# Patient Record
Sex: Female | Born: 1955 | Race: Black or African American | Hispanic: No | Marital: Married | Smoking: Never smoker
Health system: Southern US, Community
[De-identification: ages and names within clinical notes are randomized; demographics above are authoritative.]

## PROBLEM LIST (undated history)

## (undated) DIAGNOSIS — Z8659 Personal history of other mental and behavioral disorders: Secondary | ICD-10-CM

## (undated) DIAGNOSIS — I10 Essential (primary) hypertension: Secondary | ICD-10-CM

## (undated) DIAGNOSIS — G8929 Other chronic pain: Secondary | ICD-10-CM

## (undated) DIAGNOSIS — M549 Dorsalgia, unspecified: Secondary | ICD-10-CM

## (undated) DIAGNOSIS — F419 Anxiety disorder, unspecified: Secondary | ICD-10-CM

## (undated) DIAGNOSIS — E7800 Pure hypercholesterolemia, unspecified: Secondary | ICD-10-CM

## (undated) DIAGNOSIS — C50919 Malignant neoplasm of unspecified site of unspecified female breast: Secondary | ICD-10-CM

## (undated) DIAGNOSIS — F329 Major depressive disorder, single episode, unspecified: Secondary | ICD-10-CM

## (undated) DIAGNOSIS — M542 Cervicalgia: Secondary | ICD-10-CM

## (undated) DIAGNOSIS — G629 Polyneuropathy, unspecified: Secondary | ICD-10-CM

## (undated) DIAGNOSIS — M5416 Radiculopathy, lumbar region: Secondary | ICD-10-CM

## (undated) DIAGNOSIS — F32A Depression, unspecified: Secondary | ICD-10-CM

## (undated) HISTORY — PX: BREAST LUMPECTOMY: SHX2

## (undated) HISTORY — PX: THYROID SURGERY: SHX805

## (undated) HISTORY — PX: SHOULDER SURGERY: SHX246

## (undated) HISTORY — PX: CERVICAL DISC SURGERY: SHX588

## (undated) HISTORY — PX: BACK SURGERY: SHX140

---

## 2007-02-19 ENCOUNTER — Emergency Department (HOSPITAL_COMMUNITY): Admission: EM | Admit: 2007-02-19 | Discharge: 2007-02-19 | Payer: Self-pay | Admitting: Family Medicine

## 2012-11-21 ENCOUNTER — Emergency Department (HOSPITAL_COMMUNITY)
Admission: EM | Admit: 2012-11-21 | Discharge: 2012-11-21 | Disposition: A | Discharge status: Home / Self Care | Payer: BC Managed Care – PPO | Attending: Emergency Medicine | Admitting: Emergency Medicine

## 2012-11-21 ENCOUNTER — Encounter (HOSPITAL_COMMUNITY): Payer: Self-pay | Admitting: Emergency Medicine

## 2012-11-21 DIAGNOSIS — I10 Essential (primary) hypertension: Secondary | ICD-10-CM | POA: Insufficient documentation

## 2012-11-21 DIAGNOSIS — E7800 Pure hypercholesterolemia, unspecified: Secondary | ICD-10-CM | POA: Insufficient documentation

## 2012-11-21 DIAGNOSIS — L259 Unspecified contact dermatitis, unspecified cause: Secondary | ICD-10-CM | POA: Insufficient documentation

## 2012-11-21 DIAGNOSIS — Z79899 Other long term (current) drug therapy: Secondary | ICD-10-CM | POA: Insufficient documentation

## 2012-11-21 DIAGNOSIS — M5430 Sciatica, unspecified side: Secondary | ICD-10-CM | POA: Insufficient documentation

## 2012-11-21 DIAGNOSIS — Z853 Personal history of malignant neoplasm of breast: Secondary | ICD-10-CM | POA: Insufficient documentation

## 2012-11-21 DIAGNOSIS — M5431 Sciatica, right side: Secondary | ICD-10-CM

## 2012-11-21 HISTORY — DX: Personal history of other mental and behavioral disorders: Z86.59

## 2012-11-21 HISTORY — DX: Malignant neoplasm of unspecified site of unspecified female breast: C50.919

## 2012-11-21 HISTORY — DX: Pure hypercholesterolemia, unspecified: E78.00

## 2012-11-21 HISTORY — DX: Essential (primary) hypertension: I10

## 2012-11-21 MED ORDER — CYCLOBENZAPRINE HCL 10 MG PO TABS: 10.0000 mg | ORAL_TABLET | Freq: Two times a day (BID) | ORAL | Status: DC | PRN

## 2012-11-21 MED ORDER — PREDNISONE (PAK) 10 MG PO TABS: 10.0000 mg | ORAL_TABLET | Freq: Every day | ORAL | Status: DC

## 2012-11-21 NOTE — ED Provider Notes (Signed)
Medical screening examination/treatment/procedure(s) were performed by non-physician practitioner and as supervising physician I was immediately available for consultation/collaboration.  EKG Interpretation   None         Chiana Wamser, MD 11/21/12 1839 

## 2012-11-21 NOTE — ED Provider Notes (Signed)
CSN: [id number]     Arrival date & time 11/21/12  1706 History   First MD Initiated Contact with Patient 11/21/12 1718     Chief Complaint  Patient presents with  . Back Pain   (Consider location/radiation/quality/duration/timing/severity/associated sxs/prior Treatment) Patient is a 57 y.o. female presenting with back pain. The history is provided by the patient.  Back Pain Location:  Lumbar spine Quality:  Aching and burning Radiates to:  L posterior upper leg Pain severity:  Severe (7/10) Pain is:  Same all the time Onset quality:  Gradual Duration:  3 weeks Timing:  Constant Progression:  Worsening Chronicity:  New Relieved by:  Cold packs and being still Worsened by:  Twisting, standing, coughing, bending and ambulation Ineffective treatments:  None tried Associated symptoms: leg pain   Associated symptoms: no abdominal pain, no bladder incontinence, no bowel incontinence, no dysuria, no fever, no headaches and no pelvic pain   Risk factors: hx of cancer   Risk factors: no hx of osteoporosis and no steroid use    Amanda Peterson is a 57 y.o. female who presents to the ED with low back pain that radiates down her left leg. She saw her PCP and took some muscle relaxants but still has pain. She also complains of a rash on her arms, neck and face. Wants to get that checked out.   Past Medical History  Diagnosis Date  . Breast cancer   . Hypertension   . High cholesterol   . History of suicidal ideation    Past Surgical History  Procedure Laterality Date  . Cervical disc surgery    . Shoulder surgery    . Breast lumpectomy     No family history on file. History  Substance Use Topics  . Smoking status: Never Smoker   . Smokeless tobacco: Not on file  . Alcohol Use: No   OB History   Grav Para Term Preterm Abortions TAB SAB Ect Mult Living                 Review of Systems  Constitutional: Negative for fever and chills.  HENT: Negative for facial swelling, sore  throat and trouble swallowing.   Eyes: Negative for itching.  Gastrointestinal: Negative for nausea, vomiting, abdominal pain and bowel incontinence.  Genitourinary: Negative for bladder incontinence, dysuria and pelvic pain.  Musculoskeletal: Positive for back pain.  Skin: Positive for rash.  Allergic/Immunologic: Negative for food allergies.  Neurological: Negative for headaches.  Psychiatric/Behavioral: The patient is not nervous/anxious.     Allergies  Oxycodone; Codeine; and Darvocet  Home Medications   Current Outpatient Rx  Name  Route  Sig  Dispense  Refill  . acetaminophen (TYLENOL) 500 MG tablet   Oral   Take 500 mg by mouth every 6 (six) hours as needed for pain.         Marland Kitchen ALPRAZolam (XANAX) 0.5 MG tablet   Oral   Take 0.5 mg by mouth at bedtime as needed for sleep.         . busPIRone (BUSPAR) 7.5 MG tablet   Oral   Take 7.5 mg by mouth 2 (two) times daily.         . Calcium Carbonate-Vitamin D (CALCIUM + D PO)   Oral   Take 1 tablet by mouth 2 (two) times daily.         . DULoxetine (CYMBALTA) 60 MG capsule   Oral   Take 60 mg by mouth daily.         Marland Kitchen  hydrochlorothiazide (HYDRODIURIL) 12.5 MG tablet   Oral   Take 12.5 mg by mouth daily.         Marland Kitchen HYDROcodone-acetaminophen (NORCO/VICODIN) 5-325 MG per tablet   Oral   Take 1 tablet by mouth every 6 (six) hours as needed for pain.         Marland Kitchen lubiprostone (AMITIZA) 24 MCG capsule   Oral   Take 24 mcg by mouth 2 (two) times daily with a meal.         . metoprolol (LOPRESSOR) 50 MG tablet   Oral   Take 50 mg by mouth daily.         . mirtazapine (REMERON) 15 MG tablet   Oral   Take 15 mg by mouth at bedtime.         . pantoprazole (PROTONIX) 40 MG tablet   Oral   Take 40 mg by mouth daily.         . pravastatin (PRAVACHOL) 40 MG tablet   Oral   Take 40 mg by mouth at bedtime.         . tamoxifen (NOLVADEX) 20 MG tablet   Oral   Take 20 mg by mouth daily.         Marland Kitchen  venlafaxine (EFFEXOR) 37.5 MG tablet   Oral   Take 37.5 mg by mouth daily.          BP 144/67  Pulse 91  Temp(Src) 98.3 F (36.8 C) (Oral)  Resp 18  SpO2 97% Physical Exam  Nursing note and vitals reviewed. Constitutional: She is oriented to person, place, and time. She appears well-developed and well-nourished. No distress.  HENT:  Head: Atraumatic.  Eyes: EOM are normal.  Neck: Normal range of motion. Neck supple.  Cardiovascular: Normal rate, regular rhythm and normal heart sounds.   Pulmonary/Chest: Effort normal and breath sounds normal.  Musculoskeletal: Normal range of motion. She exhibits no edema.  Pedal pulses equal, adequate circulation, good touch sensation.  Neurological: She is alert and oriented to person, place, and time. She has normal strength and normal reflexes. No cranial nerve deficit or sensory deficit. Gait normal.  Skin: Rash noted.  There are areas of the forearms, neck and face that are linear and vesicular. Consistent with contact dermitis. Most likely poison ivy.   Psychiatric: She has a normal mood and affect. Her behavior is normal.    ED Course  Procedures   MDM  57 y.o. female with sciatica right and contact dermitis. Will treat with steroids and muscle relaxants. She will take Benadryl for itching as needed. She is stable for discharge home without any immediate complications. She is to follow up with her PCP.    Medication List    TAKE these medications       cyclobenzaprine 10 MG tablet  Commonly known as:  FLEXERIL  Take 1 tablet (10 mg total) by mouth 2 (two) times daily as needed for muscle spasms.     predniSONE 10 MG tablet  Commonly known as:  STERAPRED UNI-PAK  Take 1 tablet (10 mg total) by mouth daily. Take 6 tablets PO today then 5, 4, 3, 2, 1      ASK your doctor about these medications       acetaminophen 500 MG tablet  Commonly known as:  TYLENOL  Take 500 mg by mouth every 6 (six) hours as needed for pain.      ALPRAZolam 0.5 MG tablet  Commonly known as:  Prudy Feeler  Take 0.5 mg by mouth at bedtime as needed for sleep.     AMITIZA 24 MCG capsule  Generic drug:  lubiprostone  Take 24 mcg by mouth 2 (two) times daily with a meal.     busPIRone 7.5 MG tablet  Commonly known as:  BUSPAR  Take 7.5 mg by mouth 2 (two) times daily.     CALCIUM + D PO  Take 1 tablet by mouth 2 (two) times daily.     DULoxetine 60 MG capsule  Commonly known as:  CYMBALTA  Take 60 mg by mouth daily.     hydrochlorothiazide 12.5 MG tablet  Commonly known as:  HYDRODIURIL  Take 12.5 mg by mouth daily.     HYDROcodone-acetaminophen 5-325 MG per tablet  Commonly known as:  NORCO/VICODIN  Take 1 tablet by mouth every 6 (six) hours as needed for pain.     metoprolol 50 MG tablet  Commonly known as:  LOPRESSOR  Take 50 mg by mouth daily.     mirtazapine 15 MG tablet  Commonly known as:  REMERON  Take 15 mg by mouth at bedtime.     pantoprazole 40 MG tablet  Commonly known as:  PROTONIX  Take 40 mg by mouth daily.     pravastatin 40 MG tablet  Commonly known as:  PRAVACHOL  Take 40 mg by mouth at bedtime.     tamoxifen 20 MG tablet  Commonly known as:  NOLVADEX  Take 20 mg by mouth daily.     venlafaxine 37.5 MG tablet  Commonly known as:  EFFEXOR  Take 37.5 mg by mouth daily.            [facility] Orlene Och, [place] 11/21/12 [phone]

## 2012-11-21 NOTE — ED Notes (Signed)
Pt c/o lower back pain that radiates down left leg that started three weeks ago, denies any injury, states that the pain is worse when she attempts to move her left leg, also c/o rash that started Wednesday that has continued to spead.

## 2013-01-03 ENCOUNTER — Encounter (HOSPITAL_COMMUNITY): Payer: Self-pay | Admitting: Emergency Medicine

## 2013-01-03 ENCOUNTER — Emergency Department (HOSPITAL_COMMUNITY): Payer: BC Managed Care – PPO

## 2013-01-03 ENCOUNTER — Emergency Department (HOSPITAL_COMMUNITY)
Admission: EM | Admit: 2013-01-03 | Discharge: 2013-01-03 | Disposition: A | Discharge status: Home / Self Care | Payer: BC Managed Care – PPO | Attending: Emergency Medicine | Admitting: Emergency Medicine

## 2013-01-03 DIAGNOSIS — M542 Cervicalgia: Secondary | ICD-10-CM | POA: Insufficient documentation

## 2013-01-03 DIAGNOSIS — G8929 Other chronic pain: Secondary | ICD-10-CM | POA: Insufficient documentation

## 2013-01-03 DIAGNOSIS — M545 Low back pain, unspecified: Secondary | ICD-10-CM | POA: Insufficient documentation

## 2013-01-03 DIAGNOSIS — Z79899 Other long term (current) drug therapy: Secondary | ICD-10-CM | POA: Insufficient documentation

## 2013-01-03 DIAGNOSIS — Z853 Personal history of malignant neoplasm of breast: Secondary | ICD-10-CM | POA: Insufficient documentation

## 2013-01-03 DIAGNOSIS — E7800 Pure hypercholesterolemia, unspecified: Secondary | ICD-10-CM | POA: Insufficient documentation

## 2013-01-03 DIAGNOSIS — I10 Essential (primary) hypertension: Secondary | ICD-10-CM | POA: Insufficient documentation

## 2013-01-03 MED ORDER — METHOCARBAMOL 750 MG PO TABS: 750.0000 mg | ORAL_TABLET | Freq: Three times a day (TID) | ORAL | Status: DC

## 2013-01-03 MED ORDER — NAPROXEN 500 MG PO TABS: 500.0000 mg | ORAL_TABLET | Freq: Two times a day (BID) | ORAL | Status: DC

## 2013-01-03 NOTE — ED Notes (Signed)
Pt states lower back pain x 3 mo, radiating down both legs, more on the right leg. Pt also states intermittent numbness to 2nd digit of right hand x ~ 43mo with hx of cervical spine surgery.

## 2013-01-05 NOTE — ED Provider Notes (Signed)
CSN: [id number]     Arrival date & time 01/03/13  1226 History   First MD Initiated Contact with Patient 01/03/13 1345     Chief Complaint  Patient presents with  . Back Pain   (Consider location/radiation/quality/duration/timing/severity/associated sxs/prior Treatment) Patient is a 57 y.o. female presenting with back pain. The history is provided by the patient.  Back Pain Location:  Lumbar spine Quality:  Aching and shooting Radiates to:  R thigh Pain severity:  Moderate Pain is:  Same all the time Onset quality:  Gradual Duration:  3 months Timing:  Constant Progression:  Unchanged Chronicity:  Chronic Context: not falling, not recent illness and not recent injury   Relieved by:  Narcotics Worsened by:  Bending, twisting and standing Ineffective treatments:  None tried Associated symptoms: leg pain   Associated symptoms: no abdominal pain, no abdominal swelling, no bladder incontinence, no bowel incontinence, no chest pain, no dysuria, no fever, no headaches, no numbness, no paresthesias, no pelvic pain, no perianal numbness, no tingling and no weakness    Patient also c/o chronic neck pain since having surgery several years ago.  States that she has tingling and numbness to the right index finger for one month.  States numbness "comes and goes".  She denies fever, neck stiffness, extremity weakness, dysuria, or saddle anesthesia's. Denies recent injury  Past Medical History  Diagnosis Date  . Breast cancer   . Hypertension   . High cholesterol   . History of suicidal ideation    Past Surgical History  Procedure Laterality Date  . Cervical disc surgery    . Shoulder surgery    . Breast lumpectomy     No family history on file. History  Substance Use Topics  . Smoking status: Never Smoker   . Smokeless tobacco: Not on file  . Alcohol Use: No   OB History   Grav Para Term Preterm Abortions TAB SAB Ect Mult Living                 Review of Systems   Constitutional: Negative for fever.  Eyes: Negative for visual disturbance.  Respiratory: Negative for shortness of breath.   Cardiovascular: Negative for chest pain.  Gastrointestinal: Negative for vomiting, abdominal pain, constipation and bowel incontinence.  Genitourinary: Negative for bladder incontinence, dysuria, hematuria, flank pain, decreased urine volume, difficulty urinating and pelvic pain.       No perineal numbness or incontinence of urine or feces  Musculoskeletal: Positive for back pain and neck pain. Negative for gait problem, joint swelling and neck stiffness.  Skin: Negative for rash.  Neurological: Negative for dizziness, tingling, facial asymmetry, weakness, numbness, headaches and paresthesias.  All other systems reviewed and are negative.    Allergies  Oxycodone; Codeine; and Darvocet  Home Medications   Current Outpatient Rx  Name  Route  Sig  Dispense  Refill  . acetaminophen (TYLENOL) 500 MG tablet   Oral   Take 1,000 mg by mouth every 6 (six) hours as needed for mild pain.          Marland Kitchen ALPRAZolam (XANAX) 0.5 MG tablet   Oral   Take 0.5 mg by mouth at bedtime as needed for sleep.         Marland Kitchen amLODipine (NORVASC) 2.5 MG tablet   Oral   Take 2.5 mg by mouth daily.         . busPIRone (BUSPAR) 7.5 MG tablet   Oral   Take 7.5 mg by mouth 2 (  two) times daily.         . DULoxetine (CYMBALTA) 60 MG capsule   Oral   Take 60 mg by mouth daily.         . ergocalciferol (VITAMIN D2) 50000 UNITS capsule   Oral   Take 50,000 Units by mouth once a week. saturday         . hydrochlorothiazide (HYDRODIURIL) 12.5 MG tablet   Oral   Take 12.5 mg by mouth daily.         Marland Kitchen HYDROcodone-acetaminophen (NORCO/VICODIN) 5-325 MG per tablet   Oral   Take 1 tablet by mouth every 6 (six) hours as needed for pain.         . metoprolol succinate (TOPROL-XL) 50 MG 24 hr tablet   Oral   Take 50 mg by mouth daily. Take with or immediately following a  meal.         . mirtazapine (REMERON) 15 MG tablet   Oral   Take 15 mg by mouth at bedtime.         . nabumetone (RELAFEN) 750 MG tablet   Oral   Take 750 mg by mouth 2 (two) times daily.         . pantoprazole (PROTONIX) 40 MG tablet   Oral   Take 40 mg by mouth daily.         . pravastatin (PRAVACHOL) 40 MG tablet   Oral   Take 40 mg by mouth at bedtime.         . tamoxifen (NOLVADEX) 20 MG tablet   Oral   Take 20 mg by mouth daily.         Marland Kitchen venlafaxine (EFFEXOR) 37.5 MG tablet   Oral   Take 37.5 mg by mouth daily.         . methocarbamol (ROBAXIN) 750 MG tablet   Oral   Take 1 tablet (750 mg total) by mouth 3 (three) times daily.   21 tablet   0   . naproxen (NAPROSYN) 500 MG tablet   Oral   Take 1 tablet (500 mg total) by mouth 2 (two) times daily. Take with food   20 tablet   0    BP 132/73  Pulse 80  Temp(Src) 98.2 F (36.8 C) (Oral)  Resp 16  Ht 5' 5.5" (1.664 m)  Wt 210 lb (95.255 kg)  BMI 34.40 kg/m2  SpO2 100% Physical Exam  Nursing note and vitals reviewed. Constitutional: She is oriented to person, place, and time. She appears well-developed and well-nourished. No distress.  HENT:  Head: Normocephalic and atraumatic.  Mouth/Throat: Oropharynx is clear and moist.  Eyes: EOM are normal. Pupils are equal, round, and reactive to light.  Neck: Normal range of motion and phonation normal. Neck supple. Muscular tenderness present. No spinous process tenderness present. No rigidity. No erythema present. No Brudzinski's sign and no Kernig's sign noted. No thyromegaly present.  Well healed anterior and posterior surgical scars.  No STS.   Grip strength is slightly diminished on right.  Distal sensation intact,  CR < 2 sec.  No edema, erythema of the right index finger.  Pt has full ROM of the bilateral UE's and index finger.     Cardiovascular: Normal rate, regular rhythm, normal heart sounds and intact distal pulses.   No murmur  heard. Pulmonary/Chest: Effort normal and breath sounds normal. No respiratory distress. She exhibits no tenderness.  Abdominal: Soft. She exhibits no distension. There is no tenderness.  Musculoskeletal: She exhibits tenderness. She exhibits no edema.       Cervical back: She exhibits tenderness. She exhibits normal range of motion, no bony tenderness, no swelling, no deformity, no spasm and normal pulse.       Lumbar back: She exhibits tenderness and pain. She exhibits normal range of motion, no swelling, no deformity, no laceration and normal pulse.  ttp of the lumbar paraspinal muscles.  No spinal tenderness.  DP pulses are brisk and symmetrical.  Distal sensation intact.  Hip Flexors/Extensors are intact  Lymphadenopathy:    She has no cervical adenopathy.  Neurological: She is alert and oriented to person, place, and time. She has normal strength. No sensory deficit. She exhibits normal muscle tone. Coordination and gait normal.  Reflex Scores:      Tricep reflexes are 2+ on the right side and 2+ on the left side.      Bicep reflexes are 2+ on the right side and 2+ on the left side.      Patellar reflexes are 2+ on the right side and 2+ on the left side.      Achilles reflexes are 2+ on the right side and 2+ on the left side. Skin: Skin is warm and dry. No rash noted.    ED Course  Procedures (including critical care time) Labs Review Labs Reviewed - No data to display Imaging Review Dg Lumbar Spine Complete  01/03/2013   CLINICAL DATA:  Low back pain  EXAM: LUMBAR SPINE - COMPLETE 4+ VIEW  COMPARISON:  None.  FINDINGS: Five lumbar type vertebral bodies.  Normal lumbar lordosis.  No evidence fracture dislocation. Vertebral body heights and intervertebral disc spaces are maintained.  Visualized bony pelvis appears intact.  Calcified pelvic phleboliths.  IMPRESSION: Normal lumbar spine radiographs.   Electronically Signed   By: Charline Bills M.D.   On: 01/03/2013 14:53   Ct Cervical  Spine Wo Contrast  01/03/2013   CLINICAL DATA:  Neck pain, finger numbness, history of breast cancer  EXAM: CT CERVICAL SPINE WITHOUT CONTRAST  TECHNIQUE: Multidetector CT imaging of the cervical spine was performed without intravenous contrast. Multiplanar CT image reconstructions were also generated.  COMPARISON:  None.  FINDINGS: Normal cervical lordosis.  Prior C5-6 ACDF.  Additional anterior approach interbody fusion C4-5. Complete osseous fusion is not yet present.  No fracture or dislocation is seen. Vertebral body heights are maintained.  Mild degenerative changes at C4-5, C5-6, and C6-7.  No prevertebral soft tissue swelling.  No focal osseous lesions.  Visualized lung apices are clear.  IMPRESSION: No fracture or dislocation is seen.  Postsurgical changes involving C4 through C6, as described above.  Mild degenerative changes at C4-5, C5-6, and C6-7.   Electronically Signed   By: Charline Bills M.D.   On: 01/03/2013 15:27    EKG Interpretation   None       MDM   1. Neck pain, chronic   2. Low back pain    Imaging results discussed with pt.  No concerning sx's for emergent neurological or infectious process.  No focal neuro deficits on exam, pt ambulates with steady gait.  Pt has appt with her orthopedic MD in [place], [place] on Thursday.  Will prescribe naprosyn and robaxin and advised to f/u with her orthopedic.  Pt verbalized understanding and agrees to plan.  VSS.  She appears stable for d/c    Normalee Sistare L. Winifred Balogh, PA-C 01/05/13 1235

## 2013-01-06 NOTE — ED Provider Notes (Signed)
Medical screening examination/treatment/procedure(s) were performed by non-physician practitioner and as supervising physician I was immediately available for consultation/collaboration.  EKG Interpretation   None        Doug Sou, MD 01/06/13 1152

## 2013-02-28 ENCOUNTER — Emergency Department (HOSPITAL_COMMUNITY): Payer: BC Managed Care – PPO

## 2013-02-28 ENCOUNTER — Emergency Department (HOSPITAL_COMMUNITY)
Admission: EM | Admit: 2013-02-28 | Discharge: 2013-02-28 | Discharge status: Against Medical Advice | Payer: BC Managed Care – PPO | Attending: Emergency Medicine | Admitting: Emergency Medicine

## 2013-02-28 ENCOUNTER — Encounter (HOSPITAL_COMMUNITY): Payer: Self-pay | Admitting: Emergency Medicine

## 2013-02-28 DIAGNOSIS — E876 Hypokalemia: Secondary | ICD-10-CM | POA: Insufficient documentation

## 2013-02-28 DIAGNOSIS — R5383 Other fatigue: Secondary | ICD-10-CM

## 2013-02-28 DIAGNOSIS — R221 Localized swelling, mass and lump, neck: Secondary | ICD-10-CM

## 2013-02-28 DIAGNOSIS — F3289 Other specified depressive episodes: Secondary | ICD-10-CM | POA: Insufficient documentation

## 2013-02-28 DIAGNOSIS — Z79899 Other long term (current) drug therapy: Secondary | ICD-10-CM | POA: Insufficient documentation

## 2013-02-28 DIAGNOSIS — I10 Essential (primary) hypertension: Secondary | ICD-10-CM | POA: Insufficient documentation

## 2013-02-28 DIAGNOSIS — G8929 Other chronic pain: Secondary | ICD-10-CM | POA: Insufficient documentation

## 2013-02-28 DIAGNOSIS — R220 Localized swelling, mass and lump, head: Secondary | ICD-10-CM | POA: Insufficient documentation

## 2013-02-28 DIAGNOSIS — E7800 Pure hypercholesterolemia, unspecified: Secondary | ICD-10-CM | POA: Insufficient documentation

## 2013-02-28 DIAGNOSIS — F329 Major depressive disorder, single episode, unspecified: Secondary | ICD-10-CM | POA: Insufficient documentation

## 2013-02-28 DIAGNOSIS — Z791 Long term (current) use of non-steroidal anti-inflammatories (NSAID): Secondary | ICD-10-CM | POA: Insufficient documentation

## 2013-02-28 DIAGNOSIS — Z853 Personal history of malignant neoplasm of breast: Secondary | ICD-10-CM | POA: Insufficient documentation

## 2013-02-28 DIAGNOSIS — F411 Generalized anxiety disorder: Secondary | ICD-10-CM | POA: Insufficient documentation

## 2013-02-28 DIAGNOSIS — Z8739 Personal history of other diseases of the musculoskeletal system and connective tissue: Secondary | ICD-10-CM | POA: Insufficient documentation

## 2013-02-28 HISTORY — DX: Other chronic pain: G89.29

## 2013-02-28 HISTORY — DX: Anxiety disorder, unspecified: F41.9

## 2013-02-28 HISTORY — DX: Cervicalgia: M54.2

## 2013-02-28 HISTORY — DX: Dorsalgia, unspecified: M54.9

## 2013-02-28 HISTORY — DX: Depression, unspecified: F32.A

## 2013-02-28 HISTORY — DX: Major depressive disorder, single episode, unspecified: F32.9

## 2013-02-28 HISTORY — DX: Radiculopathy, lumbar region: M54.16

## 2013-02-28 LAB — LACTIC ACID, PLASMA: Lactic Acid, Venous: 0.8 mmol/L (ref 0.5–2.2)

## 2013-02-28 LAB — CBC WITH DIFFERENTIAL/PLATELET
Basophils Absolute: 0 10*3/uL (ref 0.0–0.1)
Basophils Relative: 1 % (ref 0–1)
Eosinophils Absolute: 0.2 10*3/uL (ref 0.0–0.7)
Eosinophils Relative: 4 % (ref 0–5)
HEMATOCRIT: 37.8 % (ref 36.0–46.0)
Hemoglobin: 12.8 g/dL (ref 12.0–15.0)
LYMPHS PCT: 25 % (ref 12–46)
Lymphs Abs: 1.3 10*3/uL (ref 0.7–4.0)
MCH: 30.3 pg (ref 26.0–34.0)
MCHC: 33.9 g/dL (ref 30.0–36.0)
MCV: 89.4 fL (ref 78.0–100.0)
MONO ABS: 0.3 10*3/uL (ref 0.1–1.0)
MONOS PCT: 6 % (ref 3–12)
NEUTROS ABS: 3.5 10*3/uL (ref 1.7–7.7)
Neutrophils Relative %: 65 % (ref 43–77)
Platelets: 271 10*3/uL (ref 150–400)
RBC: 4.23 MIL/uL (ref 3.87–5.11)
RDW: 13.6 % (ref 11.5–15.5)
WBC: 5.3 10*3/uL (ref 4.0–10.5)

## 2013-02-28 LAB — URINALYSIS W MICROSCOPIC + REFLEX CULTURE
Bilirubin Urine: NEGATIVE
GLUCOSE, UA: NEGATIVE mg/dL
HGB URINE DIPSTICK: NEGATIVE
Ketones, ur: NEGATIVE mg/dL
Leukocytes, UA: NEGATIVE
Nitrite: NEGATIVE
PH: 6 (ref 5.0–8.0)
PROTEIN: NEGATIVE mg/dL
Specific Gravity, Urine: 1.02 (ref 1.005–1.030)
Urobilinogen, UA: 0.2 mg/dL (ref 0.0–1.0)

## 2013-02-28 LAB — BASIC METABOLIC PANEL
BUN: 12 mg/dL (ref 6–23)
CALCIUM: 9.5 mg/dL (ref 8.4–10.5)
CHLORIDE: 102 meq/L (ref 96–112)
CO2: 27 meq/L (ref 19–32)
CREATININE: 0.93 mg/dL (ref 0.50–1.10)
GFR calc Af Amer: 76 mL/min — ABNORMAL LOW (ref >90)
GFR calc non Af Amer: 66 mL/min — ABNORMAL LOW (ref >90)
GLUCOSE: 92 mg/dL (ref 70–99)
Potassium: 3.5 mEq/L — ABNORMAL LOW (ref 3.7–5.3)
Sodium: 141 mEq/L (ref 137–147)

## 2013-02-28 LAB — TROPONIN I: Troponin I: <0.3 ng/mL (ref <0.30)

## 2013-02-28 MED ORDER — SODIUM CHLORIDE 0.9 % IV SOLN
INTRAVENOUS | Status: DC
  Administered 2013-02-28: 16:00:00 via INTRAVENOUS

## 2013-02-28 MED ORDER — IOHEXOL 300 MG/ML  SOLN: 75.0000 mL | Freq: Once | INTRAMUSCULAR | Status: DC | PRN

## 2013-02-28 MED ORDER — POTASSIUM CHLORIDE 20 MEQ/15ML (10%) PO LIQD
40.0000 meq | Freq: Once | ORAL | Status: AC
  Administered 2013-02-28: 40 meq via ORAL
  Filled 2013-02-28: qty 30

## 2013-02-28 NOTE — Discharge Instructions (Signed)
°Emergency Department Resource Guide °1) Find a Doctor and Pay Out of Pocket °Although you won't have to find out who is covered by your insurance plan, it is a good idea to ask around and get recommendations. You will then need to call the office and see if the doctor you have chosen will accept you as a new patient and what types of options they offer for patients who are self-pay. Some doctors offer discounts or will set up payment plans for their patients who do not have insurance, but you will need to ask so you aren't surprised when you get to your appointment. ° °2) Contact Your Local Health Department °Not all health departments have doctors that can see patients for sick visits, but many do, so it is worth a call to see if yours does. If you don't know where your local health department is, you can check in your phone book. The CDC also has a tool to help you locate your state's health department, and many state websites also have listings of all of their local health departments. ° °3) Find a Walk-in Clinic °If your illness is not likely to be very severe or complicated, you may want to try a walk in clinic. These are popping up all over the country in pharmacies, drugstores, and shopping centers. They're usually staffed by nurse practitioners or physician assistants that have been trained to treat common illnesses and complaints. They're usually fairly quick and inexpensive. However, if you have serious medical issues or chronic medical problems, these are probably not your best option. ° °No Primary Care Doctor: °- Call [facility] at  832-8000 - they can help you locate a primary care doctor that  accepts your insurance, provides certain services, etc. °- Physician Referral Service- 1-800-533-3463 ° °Chronic Pain Problems: °Organization         Address  Phone   Notes  °[facility]  (336) 297-2271 Patients need to be referred by their primary care doctor.  ° °Medication  Assistance: °Organization         Address  Phone   Notes  °[place] [facility] [address] °[place], [place] [address] (336) 641-8030 --Must be a resident of [place] °-- Must have NO insurance coverage whatsoever (no Medicaid/ Medicare, etc.) °-- The pt. MUST have a primary care doctor that directs their care regularly and follows them in the community °  °[facility]  (866) 331-1348   °[employer]  (888) 892-1162   ° °Agencies that provide inexpensive medical care: °Organization         Address  Phone   Notes  °[facility]  (336) 832-8035   ° Internal Medicine    (336) 832-7272   °[facility] [address] °[place], [place] [address] (336) 832-4777   °[facility] [address], °[place] (336) 271-4999   °[facility]    (336) 373-0678   °[facility]    (336) 272-1050   °[facility] ° [address], [place] Phone:  (336) 832-4444, Fax:  (336) 832-4440 Hours of Operation:  9 am - 6 pm, M-F.  Also accepts Medicaid/Medicare and self-pay.  °[facility] ° [address], [place] Phone: (336) [phone], Fax: (336) [phone]. Hours of Operation:  8:30 am - 5:30 pm, M-F.  Also accepts Medicaid and self-pay.  °[facility] [place] [address], [place] Phone: (336) 878-6027   °[facility] [address], [place], [place] (336)723-1848, Ext. 123 Mondays & Thursdays: 7-9 AM.  First 15 patients are seen on a first come, first serve basis. °  ° °Medicaid-accepting [place] Providers: ° °Organization         Address  Phone   Notes  °[facility] [address], [place] (336) 641-2100 Also accepts self-pay patients.  °[facility] [address], [place] ° (336) 856-9996   °[facility] [address], [place]  (336) 288-8857   °[facility] [address] [place] [address], [place] (336) 299-7000   °Veita Bland [address], [place]  ° (336) 373-1557 Only accepts [place] Access Medicaid patients after they have their name applied to their card.  ° °Self-Pay (no insurance) in [place]: ° °Organization         Address  Phone   Notes  °Sickle Cell Patients, [facility] [address], [place] (336) 832-1970   °[facility] [address], [place] (336) 832-4400   °[facility] ° [address], [place] (336) 992-4800   °[facility]/Dr. Osei-Bonsu ° [address] [place] [address], [place] or [address], [place] (336) [phone] Phone number for both [place] and [place] locations is the same.  °[facility] [address], [place] (336) 299-0000   °[facility] [address] [place] [address], [place] or [address] (336) 852-7530 °(336) 878-2260   °[facility] [address], [place] (336) 350-1642, phone; (336) 294-5005, fax Sees patients 1st and 3rd Saturday of every month.  Must not qualify for public or private insurance (i.e. Medicaid, Medicare, [place] Health Choice, Veterans' Benefits) • Household income should be no more than 200% of the poverty level •The clinic cannot treat you if you are pregnant or think you are pregnant • Sexually transmitted diseases are not treated at the clinic.  ° ° °Dental Care: °Organization         Address  Phone  Notes  °[place] [facility] [address], [place] (336) 641-6152 Accepts children up to age 21 who are enrolled in Medicaid or [place] Health Choice; pregnant women with a Medicaid card; and children who have applied for Medicaid or [place] Health Choice, but were declined, whose parents can pay a reduced fee at time of service.  °[place]  [facility] [place]  [address], [place] (336) 641-7733 Accepts children up to age 21 who are enrolled in Medicaid or [place] Health Choice; pregnant women with a Medicaid card; and children who have applied for Medicaid or  Health Choice, but were declined, whose parents can pay a reduced fee at time of service.  °[facility] ° [address],  (336) 641-4533 Patients are seen by appointment only. Walk-ins are not accepted. [facility] will see patients 18 years of age and older. °Monday - Tuesday (8am-5pm) °Most Wednesdays (8:30-5pm) °$30 per visit, cash only  °[facility] ° [address], [place] (336) 641-4533 Patients are seen by appointment only. Walk-ins are not accepted. [facility] will see patients 18 years of age and older. °One   Wednesday Evening (Monthly: Volunteer Based).  $30 per visit, cash only  °[facility]  (919) [phone] for adults; Children under age 4, call Graduate Pediatric Dentistry at ([phone]) [phone]. Children aged 4-14, please call (919) [phone] to request a pediatric application. ° Dental services are provided in all areas of dental care including fillings, crowns and bridges, complete and partial dentures, implants, gum treatment, root canals, and extractions. Preventive care is also provided. Treatment is provided to both adults and children. °Patients are selected via a lottery and there is often a waiting list. °  °[facility] [address], °[place] ° (336) 763-8833 [email or web address] °  °[facility] [address], [place], [place] (336)723-1848, Ext. 123 Second and Fourth Thursday of each month, opens at 6:30 AM; Clinic ends at 9 AM.  Patients are seen on a first-come first-served basis, and a limited number are seen during each clinic.  ° °[facility] ° [address], [place], [place] (336) [phone]    Eligibility Requirements °You must have lived in [place], [place], or [place] counties for at least the last three months. °  You cannot be eligible for state or federal sponsored healthcare insurance, including Veterans Administration, Medicaid, or Medicare. °  You generally cannot be eligible for healthcare insurance through your employer.  °  How to apply: °Eligibility screenings are held every Tuesday and Wednesday afternoon from 1:00 pm until 4:00 pm. You do not need an appointment for the interview!  °[facility] [address], [place], [place] 336-631-2330   °[facility] [facility]  336-342-8273   °[place]  336-703-3100   °[facility]  336-570-6415   ° °Behavioral Health Resources in the Community: °Intensive Outpatient Programs °Organization         Address  Phone  Notes  °[facility] [address], [place], [place] 336-878-6098   °[facility] [address], [place], [place] 336-832-9800   °[facility] [address], [place], [place] ° 336-882-2125   °[facility] [address],  °[place], [place] 1-800-853-5163 or 336-641-4981   °Substance Abuse Resources °Organization         Address  Phone  Notes  °[facility]  336-882-2125   °[facility]  336-784-9470   °[facility]  336-285-9073   °[facility]  336-845-3988   °Residential & Outpatient Substance Abuse Program  1-800-659-3381   °Psychological Services °Organization         Address  Phone  Notes  °[facility]  336- 832-9600   °[facility]  336- 378-7881   °[facility] [address], [place] 1-800-853-5163 or 336-641-4981   ° °Mobile Crisis Teams °Organization         Address  Phone  Notes  °[facility], Mobile Crisis Care Unit  1-877-626-1772   °Assertive °[facility] ° [address]  [place], [place] 336-834-9664   °Sharon DeEsch [address] °[place] 336-554-5454   ° °Self-Help/Support Groups °Organization         Address  Phone             Notes  °[facility] - variety of support groups  336- 373-1402 Call for more information  °Narcotics Anonymous (NA), [facility] [address], °[place]  2 meetings at this location  ° °  Residential Treatment Programs °Organization         Address  Phone  Notes  °[facility] [address],    °[place]  1-866-801-8205   °[facility] ° [address], [place], [place] 704-293-8524   °[facility] [address], [place] 336-845-3988 Admissions: 8am-3pm M-F  °[facility] [address]    °[place], [place] 336-841-1104   °[facility] [address], [place], [place] 336-379-7146   °[facility] [address]  °[place], [place] 336-285-9073   °[facility] Intensive Outpatient [address], [place], [place] 336-852-3033   °[facility] [address]  °[place], [place] 1-877-615-2722 or 336-784-9470   °[facility] [address] [place], [place] 336-227-7417 Accepts Medicaid  °[facility] [address]  °[place] 1-800-659-3381 Substance Abuse/Addiction Treatment  ° °[place] Behavioral Health Resources °Organization         Address  Phone  Notes  °[facility]  (888) 581-9988   °Julie Brannon, PhD [address] [place], [place]   (336) 349-5553 or (336) 951-0000   °[facility]   [address] °[place], [place] (336) 349-4454   °[facility] [address], [place], [place] (336) 342-8316 Insurance/Medicaid/sponsorship through [facility]  °[facility] [address]                                    [place], [place] (336) 342-8316 Therapy/tele-psych/case    °[facility] [address]  ° [place], [place] (336) 349-2233    °Dr. Arfeen  (336) 349-4544   °[facility] [place]  [employer] [place] [facility] 1) [address], [place] °2) [address], [place] °3)  [address], [place] (336) 349-3220 °(336) 342-7768 ° °(336) 342-8140   °[place] [facility] (336) 342-1394 or (336) 342-3537 (After Hours)    ° ° ° °Take your usual prescriptions as previously directed.  Call your regular medical doctor tomorrow to schedule a follow up appointment within the next 2 days. Return to the Emergency Department immediately sooner if worsening.  ° °

## 2013-02-28 NOTE — ED Notes (Signed)
Pt wanting to leave, pt has refused CT and 2 view xray

## 2013-02-28 NOTE — ED Notes (Signed)
Patient given discharge instruction, verbalized understand. IV removed, band aid applied. Patient ambulatory out of the department.  

## 2013-02-28 NOTE — ED Notes (Signed)
Family at bedside. Patient walked to restroom. Obtained urine sample. Patient asking for a drink, explained will have to check with EDP.

## 2013-02-28 NOTE — ED Notes (Signed)
Pt back from CT. Pt refused CT. Melissa from [facility] stated that pt got up from table and refused testing, stating she was claustrophobic. Pt also refused CXR while in radiology dept. Dr. Thurnell Garbe in to speak with pt. Pt still refusing CT.

## 2013-02-28 NOTE — ED Provider Notes (Signed)
CSN: [id number]     Arrival date & time 02/28/13  1509 History   First MD Initiated Contact with Patient 02/28/13 1518     Chief Complaint  Patient presents with  . Fatigue    HPI Pt was seen at 1530.  Per pt, c/o gradual onset and persistence of constant generalized weakness/fatigue for the past 2 days. Pt also states she "feels like there's a knot on the inside of my left throat" when she swallows. Denies sore throat, no choking, no dysphagia, no fevers, no rash, no CP/palpitations, no SOB/cough, no abd pain, no N/V/D, no visual changes, no focal motor weakness, no tingling/numbness in extremities, no ataxia, no slurred speech, no facial droop.     Past Medical History  Diagnosis Date  . Breast cancer   . Hypertension   . High cholesterol   . History of suicidal ideation   . Chronic back pain   . Lumbar radiculopathy   . Chronic neck pain   . Depression   . Anxiety    Past Surgical History  Procedure Laterality Date  . Cervical disc surgery    . Shoulder surgery    . Breast lumpectomy      History  Substance Use Topics  . Smoking status: Never Smoker   . Smokeless tobacco: Not on file  . Alcohol Use: No    Review of Systems ROS: Statement: All systems negative except as marked or noted in the HPI; Constitutional: Negative for fever and chills. +generalized weakness/fatigue.; ; Eyes: Negative for eye pain, redness and discharge. ; ; ENMT: +left throat "knot." Negative for ear pain, hoarseness, nasal congestion, sinus pressure and sore throat. ; ; Cardiovascular: Negative for chest pain, palpitations, diaphoresis, dyspnea and peripheral edema. ; ; Respiratory: Negative for cough, wheezing and stridor. ; ; Gastrointestinal: Negative for nausea, vomiting, diarrhea, abdominal pain, blood in stool, hematemesis, jaundice and rectal bleeding. . ; ; Genitourinary: Negative for dysuria, flank pain and hematuria. ; ; Musculoskeletal: Negative for back pain and neck pain. Negative for  swelling and trauma.; ; Skin: Negative for pruritus, rash, abrasions, blisters, bruising and skin lesion.; ; Neuro: Negative for headache, lightheadedness and neck stiffness. Negative for altered level of consciousness , altered mental status, extremity weakness, paresthesias, involuntary movement, seizure and syncope.     Allergies  Oxycodone; Codeine; and Darvocet  Home Medications   Current Outpatient Rx  Name  Route  Sig  Dispense  Refill  . acetaminophen (TYLENOL) 500 MG tablet   Oral   Take 1,000 mg by mouth every 6 (six) hours as needed for mild pain.          Marland Kitchen ALPRAZolam (XANAX) 0.5 MG tablet   Oral   Take 0.5 mg by mouth at bedtime as needed for sleep.         Marland Kitchen amLODipine (NORVASC) 5 MG tablet   Oral   Take 5 mg by mouth daily.         . busPIRone (BUSPAR) 7.5 MG tablet   Oral   Take 7.5 mg by mouth 2 (two) times daily.         . diclofenac (VOLTAREN) 75 MG EC tablet   Oral   Take 75 mg by mouth 2 (two) times daily.         . DULoxetine (CYMBALTA) 60 MG capsule   Oral   Take 60 mg by mouth daily.         . ergocalciferol (VITAMIN D2) 50000 UNITS capsule  Oral   Take 50,000 Units by mouth once a week. saturday         . hydrochlorothiazide (HYDRODIURIL) 12.5 MG tablet   Oral   Take 12.5 mg by mouth daily.         Marland Kitchen HYDROcodone-acetaminophen (NORCO/VICODIN) 5-325 MG per tablet   Oral   Take 1 tablet by mouth every 6 (six) hours as needed for pain.         . metoprolol succinate (TOPROL-XL) 50 MG 24 hr tablet   Oral   Take 50 mg by mouth daily. Take with or immediately following a meal.         . mirtazapine (REMERON) 15 MG tablet   Oral   Take 15 mg by mouth at bedtime.         . nabumetone (RELAFEN) 750 MG tablet   Oral   Take 750 mg by mouth 2 (two) times daily.         . pantoprazole (PROTONIX) 40 MG tablet   Oral   Take 40 mg by mouth daily.         . pravastatin (PRAVACHOL) 40 MG tablet   Oral   Take 40 mg by  mouth at bedtime.         . rizatriptan (MAXALT-MLT) 10 MG disintegrating tablet   Oral   Take 10 mg by mouth as needed for migraine. May repeat in 2 hours if needed(not to exceed 3 tabs a day)         . tamoxifen (NOLVADEX) 20 MG tablet   Oral   Take 20 mg by mouth daily.         Marland Kitchen venlafaxine XR (EFFEXOR-XR) 75 MG 24 hr capsule   Oral   Take 75 mg by mouth daily.          BP 130/75  Pulse 82  Temp(Src) 97.8 F (36.6 C) (Oral)  Resp 14  Ht 5\' 5"  (1.651 m)  Wt 220 lb (99.791 kg)  BMI 36.61 kg/m2  SpO2 100% Physical Exam 1535: Physical examination:  Nursing notes reviewed; Vital signs and O2 SAT reviewed;  Constitutional: Well developed, Well nourished, Well hydrated, In no acute distress; Head:  Normocephalic, atraumatic; Eyes: EOMI, PERRL, No scleral icterus; ENMT: TM's clear bilat. +edemetous nasal turbinates bilat with clear rhinorrhea. Mouth and pharynx without lesions. No tonsillar exudates. No intra-oral edema. No submandibular or sublingual edema. No hoarse voice, no drooling, no stridor. No pain with manipulation of larynx.  Mouth and pharynx normal, Mucous membranes moist; Neck: Supple, Full range of motion, No lymphadenopathy. No palp masses. No meningeal signs.; Cardiovascular: Regular rate and rhythm, No murmur, rub, or gallop; Respiratory: Breath sounds clear & equal bilaterally, No rales, rhonchi, wheezes.  Speaking full sentences with ease, Normal respiratory effort/excursion; Chest: Nontender, Movement normal; Abdomen: Soft, Nontender, Nondistended, Normal bowel sounds; Genitourinary: No CVA tenderness; Spine:  No midline CS, TS, LS tenderness. +TTP left hypertonic trapezius muscle;; Extremities: Pulses normal, No tenderness, No edema, No calf edema or asymmetry.; Neuro: AA&Ox3, Major CN grossly intact. No facial droop. Speech clear.  No nystagmus. Grips equal. Strength 5/5 equal bilat UE's and LE's.  DTR 2/4 equal bilat UE's and LE's.  No gross sensory deficits.   Normal cerebellar testing bilat UE's (finger-nose) and LE's (heel-shin). Climbs on and off stretcher easily by herself. Gait steady..; Skin: Color normal, Warm, Dry.   ED Course  Procedures   EKG Interpretation    Date/Time:  Sunday February 28 2013 15:12:37  EST Ventricular Rate:  84 PR Interval:  148 QRS Duration: 76 QT Interval:  424 QTC Calculation: 501 R Axis:   54 Text Interpretation:  Normal sinus rhythm Baseline wander Prolonged QT Abnormal ECG No previous ECGs available Confirmed by [facility]  MD, Nunzio Cory [phone]) on 02/28/2013 3:45:48 PM            MDM  MDM Reviewed: previous chart, nursing note and vitals Interpretation: labs and ECG   Results for orders placed during the hospital encounter of 02/28/13  URINALYSIS W MICROSCOPIC + REFLEX CULTURE      Result Value Range   Color, Urine YELLOW  YELLOW   APPearance CLEAR  CLEAR   Specific Gravity, Urine 1.020  1.005 - 1.030   pH 6.0  5.0 - 8.0   Glucose, UA NEGATIVE  NEGATIVE mg/dL   Hgb urine dipstick NEGATIVE  NEGATIVE   Bilirubin Urine NEGATIVE  NEGATIVE   Ketones, ur NEGATIVE  NEGATIVE mg/dL   Protein, ur NEGATIVE  NEGATIVE mg/dL   Urobilinogen, UA 0.2  0.0 - 1.0 mg/dL   Nitrite NEGATIVE  NEGATIVE   Leukocytes, UA NEGATIVE  NEGATIVE  CBC WITH DIFFERENTIAL      Result Value Range   WBC 5.3  4.0 - 10.5 K/uL   RBC 4.23  3.87 - 5.11 MIL/uL   Hemoglobin 12.8  12.0 - 15.0 g/dL   HCT 37.8  36.0 - 46.0 %   MCV 89.4  78.0 - 100.0 fL   MCH 30.3  26.0 - 34.0 pg   MCHC 33.9  30.0 - 36.0 g/dL   RDW 13.6  11.5 - 15.5 %   Platelets 271  150 - 400 K/uL   Neutrophils Relative % 65  43 - 77 %   Neutro Abs 3.5  1.7 - 7.7 K/uL   Lymphocytes Relative 25  12 - 46 %   Lymphs Abs 1.3  0.7 - 4.0 K/uL   Monocytes Relative 6  3 - 12 %   Monocytes Absolute 0.3  0.1 - 1.0 K/uL   Eosinophils Relative 4  0 - 5 %   Eosinophils Absolute 0.2  0.0 - 0.7 K/uL   Basophils Relative 1  0 - 1 %   Basophils Absolute 0.0  0.0 - 0.1 K/uL   BASIC METABOLIC PANEL      Result Value Range   Sodium 141  137 - 147 mEq/L   Potassium 3.5 (*) 3.7 - 5.3 mEq/L   Chloride 102  96 - 112 mEq/L   CO2 27  19 - 32 mEq/L   Glucose, Bld 92  70 - 99 mg/dL   BUN 12  6 - 23 mg/dL   Creatinine, Ser 0.93  0.50 - 1.10 mg/dL   Calcium 9.5  8.4 - 10.5 mg/dL   GFR calc non Af Amer 66 (*) >90 mL/min   GFR calc Af Amer 76 (*) >90 mL/min  TROPONIN I      Result Value Range   Troponin I <0.30  <0.30 ng/mL  LACTIC ACID, PLASMA      Result Value Range   Lactic Acid, Venous 0.8  0.5 - 2.2 mmol/L   Dg Chest Port 1 View 02/28/2013   CLINICAL DATA:  Fatigue  EXAM: PORTABLE CHEST - 1 VIEW  COMPARISON:  None.  FINDINGS: There is mild elevation of the right diaphragm. There is no focal parenchymal opacity, pleural effusion, or pneumothorax. The heart and mediastinal contours are unremarkable.  The osseous structures are unremarkable.  IMPRESSION:  No active disease.   Electronically Signed   By: Kathreen Devoid   On: 02/28/2013 19:10    1945:  Pt not orthostatic on VS. Potassium repleted PO. Tol PO well without N/V or choking/gagging. Has been ambulatory with steady gait. Refuses to allow any imaging study of her neck stating she is "claustrophobic." Also refused CXR in the [facility] department for the same reason. ED RN, CT Tech and I all explained CT machine was not like MRI machine, as well as the XR machine would not be enclosed. Pt refuses CT scan but was agreeable to [facility]. Pt informed that I cannot tell her what is wrong with her throat if I do not have an imaging study.  Verb understanding and continues to refuse. Pt wants to leave now, refusing any imaging study of her neck area. I encouraged pt to have imaging performed, she continues to refuse.  Pt makes her own medical decisions.  Risks of AMA explained to pt and family, including, but not limited to:  Throat tumor, FB, abscess, vascular abnormality, stroke, heart attack, cardiac arrythmia ("irregular heart  rate/beat"), "passing out," temporary and/or permanent disability, death.  Pt and family verb understanding and continue to refuse any imaging study to further evaluate her neck, understanding the consequences of their decision.  I encouraged pt to follow up with her PMD tomorrow and return to the ED immediately if symptoms worsen, she changes her mind, or for any other concerns.  Pt and family verb understanding, agreeable.       Alfonzo Feller, DO 03/01/13 1949

## 2013-02-28 NOTE — ED Notes (Signed)
Pt states generalized weakness. Dizziness since yesterday, States, "feel like a knot on the left side of my throat"

## 2013-12-25 ENCOUNTER — Emergency Department (HOSPITAL_COMMUNITY)
Admission: EM | Admit: 2013-12-25 | Discharge: 2013-12-25 | Disposition: A | Discharge status: Home / Self Care | Payer: BC Managed Care – PPO | Attending: Emergency Medicine | Admitting: Emergency Medicine

## 2013-12-25 ENCOUNTER — Encounter (HOSPITAL_COMMUNITY): Payer: Self-pay | Admitting: *Deleted

## 2013-12-25 DIAGNOSIS — R1012 Left upper quadrant pain: Secondary | ICD-10-CM | POA: Diagnosis present

## 2013-12-25 DIAGNOSIS — Z79899 Other long term (current) drug therapy: Secondary | ICD-10-CM | POA: Insufficient documentation

## 2013-12-25 DIAGNOSIS — Z9889 Other specified postprocedural states: Secondary | ICD-10-CM | POA: Diagnosis not present

## 2013-12-25 DIAGNOSIS — I10 Essential (primary) hypertension: Secondary | ICD-10-CM | POA: Insufficient documentation

## 2013-12-25 DIAGNOSIS — F329 Major depressive disorder, single episode, unspecified: Secondary | ICD-10-CM | POA: Insufficient documentation

## 2013-12-25 DIAGNOSIS — Z853 Personal history of malignant neoplasm of breast: Secondary | ICD-10-CM | POA: Insufficient documentation

## 2013-12-25 DIAGNOSIS — E780 Pure hypercholesterolemia: Secondary | ICD-10-CM | POA: Diagnosis not present

## 2013-12-25 DIAGNOSIS — R110 Nausea: Secondary | ICD-10-CM | POA: Diagnosis not present

## 2013-12-25 DIAGNOSIS — R140 Abdominal distension (gaseous): Secondary | ICD-10-CM | POA: Diagnosis not present

## 2013-12-25 DIAGNOSIS — Z8739 Personal history of other diseases of the musculoskeletal system and connective tissue: Secondary | ICD-10-CM | POA: Diagnosis not present

## 2013-12-25 DIAGNOSIS — F419 Anxiety disorder, unspecified: Secondary | ICD-10-CM | POA: Insufficient documentation

## 2013-12-25 DIAGNOSIS — Z791 Long term (current) use of non-steroidal anti-inflammatories (NSAID): Secondary | ICD-10-CM | POA: Diagnosis not present

## 2013-12-25 LAB — CBC WITH DIFFERENTIAL/PLATELET
BASOS PCT: 1 % (ref 0–1)
Basophils Absolute: 0 10*3/uL (ref 0.0–0.1)
EOS PCT: 5 % (ref 0–5)
Eosinophils Absolute: 0.3 10*3/uL (ref 0.0–0.7)
HEMATOCRIT: 35.3 % — AB (ref 36.0–46.0)
Hemoglobin: 11.7 g/dL — ABNORMAL LOW (ref 12.0–15.0)
LYMPHS PCT: 28 % (ref 12–46)
Lymphs Abs: 1.5 10*3/uL (ref 0.7–4.0)
MCH: 28.7 pg (ref 26.0–34.0)
MCHC: 33.1 g/dL (ref 30.0–36.0)
MCV: 86.7 fL (ref 78.0–100.0)
MONO ABS: 0.5 10*3/uL (ref 0.1–1.0)
Monocytes Relative: 8 % (ref 3–12)
Neutro Abs: 3.3 10*3/uL (ref 1.7–7.7)
Neutrophils Relative %: 58 % (ref 43–77)
Platelets: 337 10*3/uL (ref 150–400)
RBC: 4.07 MIL/uL (ref 3.87–5.11)
RDW: 14 % (ref 11.5–15.5)
WBC: 5.6 10*3/uL (ref 4.0–10.5)

## 2013-12-25 LAB — URINE MICROSCOPIC-ADD ON

## 2013-12-25 LAB — COMPREHENSIVE METABOLIC PANEL
ALT: 15 U/L (ref 0–35)
AST: 19 U/L (ref 0–37)
Albumin: 3.6 g/dL (ref 3.5–5.2)
Alkaline Phosphatase: 121 U/L — ABNORMAL HIGH (ref 39–117)
Anion gap: 12 (ref 5–15)
BUN: 13 mg/dL (ref 6–23)
CALCIUM: 9.5 mg/dL (ref 8.4–10.5)
CO2: 28 meq/L (ref 19–32)
CREATININE: 1.13 mg/dL — AB (ref 0.50–1.10)
Chloride: 100 mEq/L (ref 96–112)
GFR calc Af Amer: 60 mL/min — ABNORMAL LOW (ref >90)
GFR calc non Af Amer: 52 mL/min — ABNORMAL LOW (ref >90)
Glucose, Bld: 98 mg/dL (ref 70–99)
Potassium: 3.6 mEq/L — ABNORMAL LOW (ref 3.7–5.3)
Sodium: 140 mEq/L (ref 137–147)
Total Bilirubin: <0.2 mg/dL — ABNORMAL LOW (ref 0.3–1.2)
Total Protein: 7.3 g/dL (ref 6.0–8.3)

## 2013-12-25 LAB — URINALYSIS, ROUTINE W REFLEX MICROSCOPIC
Bilirubin Urine: NEGATIVE
Glucose, UA: NEGATIVE mg/dL
Hgb urine dipstick: NEGATIVE
KETONES UR: NEGATIVE mg/dL
NITRITE: NEGATIVE
PH: 6.5 (ref 5.0–8.0)
Protein, ur: NEGATIVE mg/dL
Specific Gravity, Urine: 1.02 (ref 1.005–1.030)
Urobilinogen, UA: 0.2 mg/dL (ref 0.0–1.0)

## 2013-12-25 LAB — LIPASE, BLOOD: LIPASE: 49 U/L (ref 11–59)

## 2013-12-25 MED ORDER — HYOSCYAMINE SULFATE 0.125 MG SL SUBL: 0.1250 mg | SUBLINGUAL_TABLET | SUBLINGUAL | Status: DC | PRN

## 2013-12-25 MED ORDER — HYOSCYAMINE SULFATE 0.125 MG PO TABS
0.1250 mg | ORAL_TABLET | Freq: Once | ORAL | Status: DC
  Filled 2013-12-25: qty 1

## 2013-12-25 MED ORDER — HYOSCYAMINE SULFATE 0.125 MG PO TBDP
0.1250 mg | ORAL_TABLET | ORAL | Status: AC
  Administered 2013-12-25: 0.125 mg via SUBLINGUAL
  Filled 2013-12-25 (×2): qty 1

## 2013-12-25 NOTE — ED Provider Notes (Signed)
CSN: [id number]     Arrival date & time 12/25/13  2125 History  This chart was scribed for Amanda Blade, MD by Randa Evens, ED Scribe. This patient was seen in room APA09/APA09 and the patient's care was started at 10:46 PM.    Chief Complaint  Patient presents with  . Abdominal Pain   Patient is a 58 y.o. female presenting with abdominal pain. The history is provided by the patient. No language interpreter was used.  Abdominal Pain Associated symptoms: nausea   Associated symptoms: no fever and no vomiting    HPI Comments: Amanda Peterson is a 58 y.o. female who presents to the Emergency Department complaining of gradually worsening sharp LUQ abdominal pain onset august 2015. She states that her pain has recently worsened over the past week. She states she has associated abdominal distention and nausea. She states the pain radiates into her back. She states she hasn't had a BM the past 3 days. She states she takes Linzess daily but she feels as if it hasn't provided any relief over the past few days. She states that eating makes her pain worse.  She states she has visited a GI doctor. She states she had and endoscopy and CT scan of the abdomen (10/15) and  showed no abnormal findings.  She has not yet been diagnosed with irritable bowel syndrome Denies fever or other related symptoms.     Past Medical History  Diagnosis Date  . Breast cancer   . Hypertension   . High cholesterol   . History of suicidal ideation   . Chronic back pain   . Lumbar radiculopathy   . Chronic neck pain   . Depression   . Anxiety    Past Surgical History  Procedure Laterality Date  . Cervical disc surgery    . Shoulder surgery    . Breast lumpectomy     History reviewed. No pertinent family history. History  Substance Use Topics  . Smoking status: Never Smoker   . Smokeless tobacco: Not on file  . Alcohol Use: No   OB History    No data available     Review of Systems  Constitutional:  Negative for fever.  Gastrointestinal: Positive for nausea, abdominal pain and abdominal distention. Negative for vomiting.  All other systems reviewed and are negative.    Allergies  Oxycodone; Codeine; and Darvocet  Home Medications   Prior to Admission medications   Medication Sig Start Date End Date Taking? Authorizing Provider  acetaminophen (TYLENOL) 500 MG tablet Take 1,000 mg by mouth every 6 (six) hours as needed for mild pain.    Yes Historical Provider, MD  ALPRAZolam Duanne Moron) 0.5 MG tablet Take 0.5 mg by mouth at bedtime.    Yes Historical Provider, MD  amLODipine (NORVASC) 5 MG tablet Take 2.5 mg by mouth daily.    Yes Historical Provider, MD  anastrozole (ARIMIDEX) 1 MG tablet Take 1 mg by mouth daily.   Yes Historical Provider, MD  busPIRone (BUSPAR) 7.5 MG tablet Take 7.5 mg by mouth 2 (two) times daily.   Yes Historical Provider, MD  DULoxetine (CYMBALTA) 60 MG capsule Take 60 mg by mouth daily.   Yes Historical Provider, MD  hydrochlorothiazide (HYDRODIURIL) 12.5 MG tablet Take 12.5 mg by mouth daily.   Yes Historical Provider, MD  Linaclotide (LINZESS) 290 MCG CAPS capsule Take 290 mcg by mouth daily.   Yes Historical Provider, MD  meloxicam (MOBIC) 15 MG tablet Take 15 mg by mouth  daily.   Yes Historical Provider, MD  metoprolol succinate (TOPROL-XL) 100 MG 24 hr tablet Take 100 mg by mouth daily. Take with or immediately following a meal.   Yes Historical Provider, MD  mirtazapine (REMERON) 30 MG tablet Take 30 mg by mouth at bedtime.   Yes Historical Provider, MD  pantoprazole (PROTONIX) 40 MG tablet Take 40 mg by mouth daily.   Yes Historical Provider, MD  PARoxetine (PAXIL) 40 MG tablet Take 40 mg by mouth every morning.   Yes Historical Provider, MD  pravastatin (PRAVACHOL) 20 MG tablet Take 20 mg by mouth daily.   Yes Historical Provider, MD  sucralfate (CARAFATE) 1 GM/10ML suspension Take 1 g by mouth 4 (four) times daily.   Yes Historical Provider, MD   tiZANidine (ZANAFLEX) 4 MG tablet Take 4 mg by mouth 3 (three) times daily as needed for muscle spasms.   Yes Historical Provider, MD  ergocalciferol (VITAMIN D2) 50000 UNITS capsule Take 50,000 Units by mouth once a week. Monday    Historical Provider, MD  hyoscyamine (LEVSIN/SL) 0.125 MG SL tablet Place 1 tablet (0.125 mg total) under the tongue every 4 (four) hours as needed. 12/25/13   Amanda Blade, MD  rizatriptan (MAXALT-MLT) 10 MG disintegrating tablet Take 10 mg by mouth as needed for migraine. May repeat in 2 hours if needed(not to exceed 3 tabs a day)    Historical Provider, MD   Triage Vitals: BP 146/86 mmHg  Pulse 79  Temp(Src) 98.2 F (36.8 C) (Oral)  Resp 18  Ht 5\' 5"  (1.651 m)  Wt 220 lb (99.791 kg)  BMI 36.61 kg/m2  SpO2 100%  Physical Exam  Constitutional: She is oriented to person, place, and time. She appears well-developed and well-nourished.  HENT:  Head: Normocephalic and atraumatic.  Eyes: Conjunctivae and EOM are normal. Pupils are equal, round, and reactive to light. No scleral icterus.  Neck: Normal range of motion and phonation normal. Neck supple.  Cardiovascular: Normal rate and regular rhythm.   Pulmonary/Chest: Effort normal and breath sounds normal. She exhibits no tenderness.  Abdominal: Soft. She exhibits no distension. There is no hepatosplenomegaly. There is tenderness. There is no guarding.  Mild LUQ tenderness   Musculoskeletal: Normal range of motion.  Neurological: She is alert and oriented to person, place, and time. She exhibits normal muscle tone.  Skin: Skin is warm and dry.  Psychiatric: She has a normal mood and affect. Her behavior is normal. Judgment and thought content normal.  Nursing note and vitals reviewed.    ED Course  Procedures (including critical care time) DIAGNOSTIC STUDIES: Oxygen Saturation is 100% on RA, normal by my interpretation.    COORDINATION OF CARE: 10:54 PM-Discussed treatment plan with pt at bedside  and pt agreed to plan.   Medications  hyoscyamine (ANASPAZ) disintergrating tablet 0.125 mg (0.125 mg Sublingual Given 12/25/13 2325)    Patient Vitals for the past 24 hrs:  BP Temp Temp src Pulse Resp SpO2 Height Weight  12/25/13 2126 146/86 mmHg 98.2 F (36.8 C) Oral 79 18 100 % 5\' 5"  (1.651 m) 220 lb (99.791 kg)    11:29 PM Reevaluation with update and discussion. After initial assessment and treatment, an updated evaluation reveals no further c/o Findings discussed with patient and family, all questions answered. [place] Review Labs Reviewed  CBC WITH DIFFERENTIAL - Abnormal; Notable for the following:    Hemoglobin 11.7 (*)    HCT 35.3 (*)    All other  components within normal limits  COMPREHENSIVE METABOLIC PANEL - Abnormal; Notable for the following:    Potassium 3.6 (*)    Creatinine, Ser 1.13 (*)    Alkaline Phosphatase 121 (*)    Total Bilirubin <0.2 (*)    GFR calc non Af Amer 52 (*)    GFR calc Af Amer 60 (*)    All other components within normal limits  URINALYSIS, ROUTINE W REFLEX MICROSCOPIC - Abnormal; Notable for the following:    Leukocytes, UA SMALL (*)    All other components within normal limits  LIPASE, BLOOD  URINE MICROSCOPIC-ADD ON    Imaging Review No results found.   EKG Interpretation None        Date: 12/25/13- MUSE Hyperlink is inactive  Rate: 77  Rhythm: normal sinus rhythm  QRS Axis: normal  PR and QT Intervals: normal  ST/T Wave abnormalities: normal  PR and QRS Conduction Disutrbances:none  Narrative Interpretation:   Old EKG Reviewed: unchanged   MDM   Final diagnoses:  Left upper quadrant pain   Chronic abdominal pain, associated with eating.  I suspect that she has irritable bowel syndrome.  However, this cannot be definitively diagnosed in the emergency department setting.  There is no evidence for serious bacterial infection.  Metabolic instability or impending vascular collapse.  Nursing Notes  Reviewed/ Care Coordinated Applicable Imaging Reviewed Interpretation of Laboratory Data incorporated into ED treatment  The patient appears reasonably screened and/or stabilized for discharge and I doubt any other medical condition or other [facility] requiring further screening, evaluation, or treatment in the ED at this time prior to discharge.  Plan: Home Medications- Levsin; Home Treatments- rest; return here if the recommended treatment, does not improve the symptoms; Recommended follow up- GI f/u asap    I personally performed the services described in this documentation, which was scribed in my presence. The recorded information has been reviewed and is accurate.       Amanda Blade, MD 12/25/13 [phone]

## 2013-12-25 NOTE — ED Notes (Signed)
Pt alert & oriented x4, stable gait. Patient given discharge instructions, paperwork & prescription(s). Patient  instructed to stop at the registration desk to finish any additional paperwork. Patient verbalized understanding. Pt left department w/ no further questions. 

## 2013-12-25 NOTE — ED Notes (Signed)
Pt states upper abdominal pain for the past few months. Pt states she has been seen & under tx by her gi dr. Pt states no real improvement. Still having upper left abdominal pain that the pt states feels like it moves around.

## 2013-12-25 NOTE — ED Notes (Signed)
Pt c/o left upper quadrant pain since august; pt states the pain has been getting worse for the last week

## 2013-12-25 NOTE — Discharge Instructions (Signed)
Try the prescription medicine 4 times a day, as needed for pain. Using an enema tonight and once in the morning to see if this can improve your bowel movements. Call your GI doctor for an appointment to be seen for possible colonoscopy and further assessment and treatment.   Abdominal Pain, Women Abdominal (stomach, pelvic, or belly) pain can be caused by many things. It is important to tell your doctor:  The location of the pain.  Does it come and go or is it present all the time?  Are there things that start the pain (eating certain foods, exercise)?  Are there other symptoms associated with the pain (fever, nausea, vomiting, diarrhea)? All of this is helpful to know when trying to find the cause of the pain. CAUSES   Stomach: virus or bacteria infection, or ulcer.  Intestine: appendicitis (inflamed appendix), regional ileitis (Crohn's disease), ulcerative colitis (inflamed colon), irritable bowel syndrome, diverticulitis (inflamed diverticulum of the colon), or cancer of the stomach or intestine.  Gallbladder disease or stones in the gallbladder.  Kidney disease, kidney stones, or infection.  Pancreas infection or cancer.  Fibromyalgia (pain disorder).  Diseases of the female organs:  Uterus: fibroid (non-cancerous) tumors or infection.  Fallopian tubes: infection or tubal pregnancy.  Ovary: cysts or tumors.  Pelvic adhesions (scar tissue).  Endometriosis (uterus lining tissue growing in the pelvis and on the pelvic organs).  Pelvic congestion syndrome (female organs filling up with blood just before the menstrual period).  Pain with the menstrual period.  Pain with ovulation (producing an egg).  Pain with an IUD (intrauterine device, birth control) in the uterus.  Cancer of the female organs.  Functional pain (pain not caused by a disease, may improve without treatment).  Psychological pain.  Depression. DIAGNOSIS  Your doctor will decide the seriousness  of your pain by doing an examination.  Blood tests.  X-rays.  Ultrasound.  CT scan (computed tomography, special type of X-ray).  MRI (magnetic resonance imaging).  Cultures, for infection.  Barium enema (dye inserted in the large intestine, to better view it with X-rays).  Colonoscopy (looking in intestine with a lighted tube).  Laparoscopy (minor surgery, looking in abdomen with a lighted tube).  Major abdominal exploratory surgery (looking in abdomen with a large incision). TREATMENT  The treatment will depend on the cause of the pain.   Many cases can be observed and treated at home.  Over-the-counter medicines recommended by your caregiver.  Prescription medicine.  Antibiotics, for infection.  Birth control pills, for painful periods or for ovulation pain.  Hormone treatment, for endometriosis.  Nerve blocking injections.  Physical therapy.  Antidepressants.  Counseling with a psychologist or psychiatrist.  Minor or major surgery. HOME CARE INSTRUCTIONS   Do not take laxatives, unless directed by your caregiver.  Take over-the-counter pain medicine only if ordered by your caregiver. Do not take aspirin because it can cause an upset stomach or bleeding.  Try a clear liquid diet (broth or water) as ordered by your caregiver. Slowly move to a bland diet, as tolerated, if the pain is related to the stomach or intestine.  Have a thermometer and take your temperature several times a day, and record it.  Bed rest and sleep, if it helps the pain.  Avoid sexual intercourse, if it causes pain.  Avoid stressful situations.  Keep your follow-up appointments and tests, as your caregiver orders.  If the pain does not go away with medicine or surgery, you may try:  Acupuncture.  Relaxation exercises (yoga, meditation).  Group therapy.  Counseling. SEEK MEDICAL CARE IF:   You notice certain foods cause stomach pain.  Your home care treatment is not  helping your pain.  You need stronger pain medicine.  You want your IUD removed.  You feel faint or lightheaded.  You develop nausea and vomiting.  You develop a rash.  You are having side effects or an allergy to your medicine. SEEK IMMEDIATE MEDICAL CARE IF:   Your pain does not go away or gets worse.  You have a fever.  Your pain is felt only in portions of the abdomen. The right side could possibly be appendicitis. The left lower portion of the abdomen could be colitis or diverticulitis.  You are passing blood in your stools (bright red or black tarry stools, with or without vomiting).  You have blood in your urine.  You develop chills, with or without a fever.  You pass out. MAKE SURE YOU:   Understand these instructions.  Will watch your condition.  Will get help right away if you are not doing well or get worse. Document Released: 11/11/2006 Document Revised: 05/31/2013 Document Reviewed: 12/01/2008 [facility] Patient Information 2015 [place], [place]. This information is not intended to replace advice given to you by your health care provider. Make sure you discuss any questions you have with your health care provider.

## 2015-06-03 ENCOUNTER — Encounter (HOSPITAL_COMMUNITY): Payer: Self-pay | Admitting: Emergency Medicine

## 2015-06-03 ENCOUNTER — Emergency Department (HOSPITAL_COMMUNITY)
Admission: EM | Admit: 2015-06-03 | Discharge: 2015-06-03 | Disposition: A | Discharge status: Home / Self Care | Payer: Medicare Other | Attending: Emergency Medicine | Admitting: Emergency Medicine

## 2015-06-03 DIAGNOSIS — R1030 Lower abdominal pain, unspecified: Secondary | ICD-10-CM | POA: Insufficient documentation

## 2015-06-03 DIAGNOSIS — I10 Essential (primary) hypertension: Secondary | ICD-10-CM | POA: Insufficient documentation

## 2015-06-03 DIAGNOSIS — R110 Nausea: Secondary | ICD-10-CM | POA: Insufficient documentation

## 2015-06-03 DIAGNOSIS — R531 Weakness: Secondary | ICD-10-CM

## 2015-06-03 DIAGNOSIS — S70262A Insect bite (nonvenomous), left hip, initial encounter: Secondary | ICD-10-CM | POA: Insufficient documentation

## 2015-06-03 DIAGNOSIS — Z79899 Other long term (current) drug therapy: Secondary | ICD-10-CM | POA: Diagnosis not present

## 2015-06-03 DIAGNOSIS — W57XXXA Bitten or stung by nonvenomous insect and other nonvenomous arthropods, initial encounter: Secondary | ICD-10-CM

## 2015-06-03 DIAGNOSIS — F329 Major depressive disorder, single episode, unspecified: Secondary | ICD-10-CM | POA: Diagnosis not present

## 2015-06-03 DIAGNOSIS — Y939 Activity, unspecified: Secondary | ICD-10-CM | POA: Insufficient documentation

## 2015-06-03 DIAGNOSIS — Y929 Unspecified place or not applicable: Secondary | ICD-10-CM | POA: Diagnosis not present

## 2015-06-03 DIAGNOSIS — Y999 Unspecified external cause status: Secondary | ICD-10-CM | POA: Insufficient documentation

## 2015-06-03 DIAGNOSIS — E669 Obesity, unspecified: Secondary | ICD-10-CM | POA: Insufficient documentation

## 2015-06-03 DIAGNOSIS — R51 Headache: Secondary | ICD-10-CM | POA: Diagnosis not present

## 2015-06-03 LAB — COMPREHENSIVE METABOLIC PANEL
ALK PHOS: 87 U/L (ref 38–126)
ALT: 11 U/L — ABNORMAL LOW (ref 14–54)
AST: 14 U/L — ABNORMAL LOW (ref 15–41)
Albumin: 3.6 g/dL (ref 3.5–5.0)
Anion gap: 6 (ref 5–15)
BILIRUBIN TOTAL: 0.4 mg/dL (ref 0.3–1.2)
BUN: 15 mg/dL (ref 6–20)
CALCIUM: 9 mg/dL (ref 8.9–10.3)
CO2: 29 mmol/L (ref 22–32)
Chloride: 104 mmol/L (ref 101–111)
Creatinine, Ser: 1.07 mg/dL — ABNORMAL HIGH (ref 0.44–1.00)
GFR calc non Af Amer: 55 mL/min — ABNORMAL LOW (ref >60)
Glucose, Bld: 112 mg/dL — ABNORMAL HIGH (ref 65–99)
POTASSIUM: 3.8 mmol/L (ref 3.5–5.1)
SODIUM: 139 mmol/L (ref 135–145)
TOTAL PROTEIN: 6.7 g/dL (ref 6.5–8.1)

## 2015-06-03 LAB — CBC WITH DIFFERENTIAL/PLATELET
BASOS ABS: 0 10*3/uL (ref 0.0–0.1)
BASOS PCT: 1 %
Eosinophils Absolute: 0.3 10*3/uL (ref 0.0–0.7)
Eosinophils Relative: 7 %
HCT: 34.9 % — ABNORMAL LOW (ref 36.0–46.0)
Hemoglobin: 11.4 g/dL — ABNORMAL LOW (ref 12.0–15.0)
Lymphocytes Relative: 41 %
Lymphs Abs: 1.9 10*3/uL (ref 0.7–4.0)
MCH: 28.4 pg (ref 26.0–34.0)
MCHC: 32.7 g/dL (ref 30.0–36.0)
MCV: 87 fL (ref 78.0–100.0)
MONO ABS: 0.3 10*3/uL (ref 0.1–1.0)
Monocytes Relative: 7 %
NEUTROS ABS: 2.1 10*3/uL (ref 1.7–7.7)
NEUTROS PCT: 44 %
Platelets: 271 10*3/uL (ref 150–400)
RBC: 4.01 MIL/uL (ref 3.87–5.11)
RDW: 13.9 % (ref 11.5–15.5)
WBC: 4.6 10*3/uL (ref 4.0–10.5)

## 2015-06-03 LAB — URINALYSIS, ROUTINE W REFLEX MICROSCOPIC
Bilirubin Urine: NEGATIVE
Glucose, UA: NEGATIVE mg/dL
Hgb urine dipstick: NEGATIVE
Ketones, ur: NEGATIVE mg/dL
LEUKOCYTES UA: NEGATIVE
Nitrite: NEGATIVE
Specific Gravity, Urine: 1.02 (ref 1.005–1.030)
pH: 7 (ref 5.0–8.0)

## 2015-06-03 LAB — URINE MICROSCOPIC-ADD ON

## 2015-06-03 MED ORDER — DOXYCYCLINE HYCLATE 100 MG PO CAPS: 100.0000 mg | ORAL_CAPSULE | Freq: Two times a day (BID) | ORAL | Status: DC

## 2015-06-03 MED ORDER — DOXYCYCLINE HYCLATE 100 MG PO TABS
100.0000 mg | ORAL_TABLET | Freq: Once | ORAL | Status: AC
  Administered 2015-06-03: 100 mg via ORAL
  Filled 2015-06-03: qty 1

## 2015-06-03 MED ORDER — SODIUM CHLORIDE 0.9 % IV BOLUS (SEPSIS)
1000.0000 mL | Freq: Once | INTRAVENOUS | Status: AC
  Administered 2015-06-03: 1000 mL via INTRAVENOUS

## 2015-06-03 MED ORDER — METOCLOPRAMIDE HCL 5 MG/ML IJ SOLN
10.0000 mg | Freq: Once | INTRAMUSCULAR | Status: AC
  Administered 2015-06-03: 10 mg via INTRAVENOUS
  Filled 2015-06-03: qty 2

## 2015-06-03 NOTE — ED Notes (Signed)
Tick removed from patient's left flank by EDP.

## 2015-06-03 NOTE — Discharge Instructions (Signed)
Tick Bite Information Take the antibiotic as prescribed see your primary care physician if you don't continue to feel well by next week. Make sure that you drink at least six 8 ounce glasses of water daily. Return if concerned for any reason. Ticks are insects that attach themselves to the skin and draw blood for food. There are various types of ticks. Common types include wood ticks and deer ticks. Most ticks live in shrubs and grassy areas. Ticks can climb onto your body when you make contact with leaves or grass where the tick is waiting. The most common places on the body for ticks to attach themselves are the scalp, neck, armpits, waist, and groin. Most tick bites are harmless, but sometimes ticks carry germs that cause diseases. These germs can be spread to a person during the tick's feeding process. The chance of a disease spreading through a tick bite depends on:   The type of tick.  Time of year.   How long the tick is attached.   Geographic location.  HOW CAN YOU PREVENT TICK BITES? Take these steps to help prevent tick bites when you are outdoors:  Wear protective clothing. Long sleeves and long pants are best.   Wear white clothes so you can see ticks more easily.  Tuck your pant legs into your socks.   If walking on a trail, stay in the middle of the trail to avoid brushing against bushes.  Avoid walking through areas with long grass.  Put insect repellent on all exposed skin and along boot tops, pant legs, and sleeve cuffs.   Check clothing, hair, and skin repeatedly and before going inside.   Brush off any ticks that are not attached.  Take a shower or bath as soon as possible after being outdoors.  WHAT IS THE PROPER WAY TO REMOVE A TICK? Ticks should be removed as soon as possible to help prevent diseases caused by tick bites.  If latex gloves are available, put them on before trying to remove a tick.   Using fine-point tweezers, grasp the tick as  close to the skin as possible. You may also use curved forceps or a tick removal tool. Grasp the tick as close to its head as possible. Avoid grasping the tick on its body.  Pull gently with steady upward pressure until the tick lets go. Do not twist the tick or jerk it suddenly. This may break off the tick's head or mouth parts.  Do not squeeze or crush the tick's body. This could force disease-carrying fluids from the tick into your body.   After the tick is removed, wash the bite area and your hands with soap and water or other disinfectant such as alcohol.  Apply a small amount of antiseptic cream or ointment to the bite site.   Wash and disinfect any instruments that were used.  Do not try to remove a tick by applying a hot match, petroleum jelly, or fingernail polish to the tick. These methods do not work and may increase the chances of disease being spread from the tick bite.  WHEN SHOULD YOU SEEK MEDICAL CARE? Contact your health care provider if you are unable to remove a tick from your skin or if a part of the tick breaks off and is stuck in the skin.  After a tick bite, you need to be aware of signs and symptoms that could be related to diseases spread by ticks. Contact your health care provider if you develop any  of the following in the days or weeks after the tick bite:  Unexplained fever.  Rash. A circular rash that appears days or weeks after the tick bite may indicate the possibility of Lyme disease. The rash may resemble a target with a bull's-eye and may occur at a different part of your body than the tick bite.  Redness and swelling in the area of the tick bite.   Tender, swollen lymph glands.   Diarrhea.   Weight loss.   Cough.   Fatigue.   Muscle, joint, or bone pain.   Abdominal pain.   Headache.   Lethargy or a change in your level of consciousness.  Difficulty walking or moving your legs.   Numbness in the legs.    Paralysis.  Shortness of breath.   Confusion.   Repeated vomiting.    This information is not intended to replace advice given to you by your health care provider. Make sure you discuss any questions you have with your health care provider.   Document Released: 01/12/2000 Document Revised: 02/04/2014 Document Reviewed: 06/24/2012 Elsevier Interactive Patient Education 2016 [employer].  Fatigue Fatigue is feeling tired all of the time, a lack of energy, or a lack of motivation. Occasional or mild fatigue is often a normal response to activity or life in general. However, long-lasting (chronic) or extreme fatigue may indicate an underlying medical condition. HOME CARE INSTRUCTIONS  Watch your fatigue for any changes. The following actions may help to lessen any discomfort you are feeling:  Talk to your health care provider about how much sleep you need each night. Try to get the required amount every night.  Take medicines only as directed by your health care provider.  Eat a healthy and nutritious diet. Ask your health care provider if you need help changing your diet.  Drink enough fluid to keep your urine clear or pale yellow.  Practice ways of relaxing, such as yoga, meditation, massage therapy, or acupuncture.  Exercise regularly.   Change situations that cause you stress. Try to keep your work and personal routine reasonable.  Do not abuse illegal drugs.  Limit alcohol intake to no more than 1 drink per day for nonpregnant women and 2 drinks per day for men. One drink equals 12 ounces of beer, 5 ounces of wine, or 1 ounces of hard liquor.  Take a multivitamin, if directed by your health care provider. SEEK MEDICAL CARE IF:   Your fatigue does not get better.  You have a fever.   You have unintentional weight loss or gain.  You have headaches.   You have difficulty:   Falling asleep.  Sleeping throughout the night.  You feel angry, guilty,  anxious, or sad.   You are unable to have a bowel movement (constipation).   You skin is dry.   Your legs or another part of your body is swollen.  SEEK IMMEDIATE MEDICAL CARE IF:   You feel confused.   Your vision is blurry.  You feel faint or pass out.   You have a severe headache.   You have severe abdominal, pelvic, or back pain.   You have chest pain, shortness of breath, or an irregular or fast heartbeat.   You are unable to urinate or you urinate less than normal.   You develop abnormal bleeding, such as bleeding from the rectum, vagina, nose, lungs, or nipples.  You vomit blood.   You have thoughts about harming yourself or committing suicide.   You  are worried that you might harm someone else.    This information is not intended to replace advice given to you by your health care provider. Make sure you discuss any questions you have with your health care provider.   Document Released: 11/11/2006 Document Revised: 02/04/2014 Document Reviewed: 05/18/2013 Elsevier Interactive Patient Education [employer].

## 2015-06-03 NOTE — ED Provider Notes (Signed)
CSN: [id number]     Arrival date & time 06/03/15  1550 History   First MD Initiated Contact with Patient 06/03/15 1604     Chief Complaint  Patient presents with  . Fatigue     (Consider location/radiation/quality/duration/timing/severity/associated sxs/prior Treatment) HPI Complains a frontal headache and low abdominal discomfort and nausea and fatigue for the past 2 weeks. She noticed a bump on her left hip 1 week ago which her husband determine today was a take attached to her skin here patient thought was a skintag. She denies any fever. No treatment prior to coming here she denies nausea or vomiting treating herself with Tylenol with partial relief. Last bowel movement 6 days ago, normal. Nothing makes symptoms better or worse. She did eat breakfast this morning and drank coffee. No known fever. Admits to "hot flashes. No chest pain no shortness of breath no vomiting. No other associated symptoms. Patient was treated for a rash with steroids and antibiotics 3 weeks ago which resolved 2 weeks ago, after 1 week of treatment Past Medical History  Diagnosis Date  . Hypertension   . High cholesterol   . History of suicidal ideation   . Chronic back pain   . Lumbar radiculopathy   . Chronic neck pain   . Depression   . Anxiety   . Breast cancer [facility])    Past Surgical History  Procedure Laterality Date  . Cervical disc surgery    . Shoulder surgery    . Breast lumpectomy     History reviewed. No pertinent family history. Social History  Substance Use Topics  . Smoking status: Never Smoker   . Smokeless tobacco: None  . Alcohol Use: No  No illicit drug use OB History    No data available     Review of Systems  Constitutional: Positive for fatigue.  Gastrointestinal: Positive for nausea and abdominal pain.  Skin: Positive for rash.  Neurological: Positive for headaches.      Allergies  Oxycodone; Codeine; and Darvocet  Home Medications   Prior to Admission  medications   Medication Sig Start Date End Date Taking? Authorizing Provider  acetaminophen (TYLENOL) 500 MG tablet Take 1,000 mg by mouth every 6 (six) hours as needed for mild pain.     Historical Provider, MD  ALPRAZolam Duanne Moron) 0.5 MG tablet Take 0.5 mg by mouth at bedtime.     Historical Provider, MD  amLODipine (NORVASC) 5 MG tablet Take 2.5 mg by mouth daily.     Historical Provider, MD  anastrozole (ARIMIDEX) 1 MG tablet Take 1 mg by mouth daily.    Historical Provider, MD  busPIRone (BUSPAR) 7.5 MG tablet Take 7.5 mg by mouth 2 (two) times daily.    Historical Provider, MD  DULoxetine (CYMBALTA) 60 MG capsule Take 60 mg by mouth daily.    Historical Provider, MD  ergocalciferol (VITAMIN D2) 50000 UNITS capsule Take 50,000 Units by mouth once a week. Monday    Historical Provider, MD  hydrochlorothiazide (HYDRODIURIL) 12.5 MG tablet Take 12.5 mg by mouth daily.    Historical Provider, MD  hyoscyamine (LEVSIN/SL) 0.125 MG SL tablet Place 1 tablet (0.125 mg total) under the tongue every 4 (four) hours as needed. 12/25/13   Daleen Bo, MD  Linaclotide [facility]) 290 MCG CAPS capsule Take 290 mcg by mouth daily.    Historical Provider, MD  meloxicam (MOBIC) 15 MG tablet Take 15 mg by mouth daily.    Historical Provider, MD  metoprolol succinate (TOPROL-XL) 100 MG  24 hr tablet Take 100 mg by mouth daily. Take with or immediately following a meal.    Historical Provider, MD  mirtazapine (REMERON) 30 MG tablet Take 30 mg by mouth at bedtime.    Historical Provider, MD  pantoprazole (PROTONIX) 40 MG tablet Take 40 mg by mouth daily.    Historical Provider, MD  PARoxetine (PAXIL) 40 MG tablet Take 40 mg by mouth every morning.    Historical Provider, MD  pravastatin (PRAVACHOL) 20 MG tablet Take 20 mg by mouth daily.    Historical Provider, MD  rizatriptan (MAXALT-MLT) 10 MG disintegrating tablet Take 10 mg by mouth as needed for migraine. May repeat in 2 hours if needed(not to exceed 3 tabs a  day)    Historical Provider, MD  sucralfate (CARAFATE) 1 GM/10ML suspension Take 1 g by mouth 4 (four) times daily.    Historical Provider, MD  tiZANidine (ZANAFLEX) 4 MG tablet Take 4 mg by mouth 3 (three) times daily as needed for muscle spasms.    Historical Provider, MD   BP 136/73 mmHg  Pulse 67  Temp(Src) 98.1 F (36.7 C) (Temporal)  Resp 16  Ht 5' 5.5" (1.664 m)  Wt 200 lb (90.719 kg)  BMI 32.76 kg/m2  SpO2 100% Physical Exam  Constitutional: She appears well-developed and well-nourished. No distress.  HENT:  Head: Normocephalic and atraumatic.  Eyes: Conjunctivae are normal. Pupils are equal, round, and reactive to light.  Neck: Neck supple. No tracheal deviation present. No thyromegaly present.  Cardiovascular: Normal rate and regular rhythm.   No murmur heard. Pulmonary/Chest: Effort normal and breath sounds normal.  Abdominal: Soft. Bowel sounds are normal. She exhibits no distension. There is no tenderness.  Obese  Musculoskeletal: Normal range of motion. She exhibits no edema or tenderness.  Neurological: She is alert. Coordination normal.  Skin: Skin is warm and dry. No rash noted.  There is an engorged tick at her left hip with 2 cm area surrounding reddened area. There are patchy hyperpigmented areas which appear to be chronic on all 4 extremities and on trunk, not involving face palms or soles  Psychiatric: She has a normal mood and affect.  Nursing note and vitals reviewed.   ED Course  Procedures (including critical care time) Labs Review Labs Reviewed - No data to display  Imaging Review No results found. I have personally reviewed and evaluated these images and lab results as part of my medical decision-making.   EKG Interpretation None     Procedure: Tick was removed intact from left hip with forceps.  6:40 PM patient feels much improved after treatment with intravenous Reglan and intravenous fluids. She is asymptomatic Results for orders placed or  performed during the hospital encounter of 06/03/15  Comprehensive metabolic panel  Result Value Ref Range   Sodium 139 135 - 145 mmol/L   Potassium 3.8 3.5 - 5.1 mmol/L   Chloride 104 101 - 111 mmol/L   CO2 29 22 - 32 mmol/L   Glucose, Bld 112 (H) 65 - 99 mg/dL   BUN 15 6 - 20 mg/dL   Creatinine, Ser 1.07 (H) 0.44 - 1.00 mg/dL   Calcium 9.0 8.9 - 10.3 mg/dL   Total Protein 6.7 6.5 - 8.1 g/dL   Albumin 3.6 3.5 - 5.0 g/dL   AST 14 (L) 15 - 41 U/L   ALT 11 (L) 14 - 54 U/L   Alkaline Phosphatase 87 38 - 126 U/L   Total Bilirubin 0.4 0.3 - 1.2 mg/dL  GFR calc non Af Amer 55 (L) >60 mL/min   GFR calc Af Amer >60 >60 mL/min   Anion gap 6 5 - 15  CBC with Differential/Platelet  Result Value Ref Range   WBC 4.6 4.0 - 10.5 K/uL   RBC 4.01 3.87 - 5.11 MIL/uL   Hemoglobin 11.4 (L) 12.0 - 15.0 g/dL   HCT 34.9 (L) 36.0 - 46.0 %   MCV 87.0 78.0 - 100.0 fL   MCH 28.4 26.0 - 34.0 pg   MCHC 32.7 30.0 - 36.0 g/dL   RDW 13.9 11.5 - 15.5 %   Platelets 271 150 - 400 K/uL   Neutrophils Relative % 44 %   Neutro Abs 2.1 1.7 - 7.7 K/uL   Lymphocytes Relative 41 %   Lymphs Abs 1.9 0.7 - 4.0 K/uL   Monocytes Relative 7 %   Monocytes Absolute 0.3 0.1 - 1.0 K/uL   Eosinophils Relative 7 %   Eosinophils Absolute 0.3 0.0 - 0.7 K/uL   Basophils Relative 1 %   Basophils Absolute 0.0 0.0 - 0.1 K/uL  Urinalysis, Routine w reflex microscopic (not at [facility])  Result Value Ref Range   Color, Urine YELLOW YELLOW   APPearance HAZY (A) CLEAR   Specific Gravity, Urine 1.020 1.005 - 1.030   pH 7.0 5.0 - 8.0   Glucose, UA NEGATIVE NEGATIVE mg/dL   Hgb urine dipstick NEGATIVE NEGATIVE   Bilirubin Urine NEGATIVE NEGATIVE   Ketones, ur NEGATIVE NEGATIVE mg/dL   Protein, ur TRACE (A) NEGATIVE mg/dL   Nitrite NEGATIVE NEGATIVE   Leukocytes, UA NEGATIVE NEGATIVE  Urine microscopic-add on  Result Value Ref Range   Squamous Epithelial / LPF 6-30 (A) NONE SEEN   WBC, UA 6-30 0 - 5 WBC/hpf   RBC / HPF 0-5 0 - 5  RBC/hpf   Bacteria, UA MANY (A) NONE SEEN   Urine-Other MUCOUS PRESENT    No results found.  MDM  We'll treat patient for tick borne disease in light of symptoms and tick bite with engorged tick plan prescription doxycycline. Follow-up with PMD next week Diagnosis #1 weakness #2 tick bite Final diagnoses:  None        Orlie Dakin, MD 06/03/15 1849

## 2015-06-03 NOTE — ED Notes (Signed)
Pt has multiple complaints.  States she has not been feeling well for several weeks with fatigue, rash, and nausea.  Was treated with abx and steroids 2 weeks ago.  Found a tick on her last night, left it attached for our removal.

## 2015-08-08 ENCOUNTER — Emergency Department (HOSPITAL_COMMUNITY): Payer: Medicare Other

## 2015-08-08 ENCOUNTER — Encounter (HOSPITAL_COMMUNITY): Payer: Self-pay | Admitting: Emergency Medicine

## 2015-08-08 ENCOUNTER — Emergency Department (HOSPITAL_COMMUNITY)
Admission: EM | Admit: 2015-08-08 | Discharge: 2015-08-08 | Disposition: A | Discharge status: Home / Self Care | Payer: Medicare Other | Attending: Emergency Medicine | Admitting: Emergency Medicine

## 2015-08-08 DIAGNOSIS — R42 Dizziness and giddiness: Secondary | ICD-10-CM | POA: Diagnosis not present

## 2015-08-08 DIAGNOSIS — R531 Weakness: Secondary | ICD-10-CM | POA: Diagnosis present

## 2015-08-08 DIAGNOSIS — Z853 Personal history of malignant neoplasm of breast: Secondary | ICD-10-CM | POA: Insufficient documentation

## 2015-08-08 DIAGNOSIS — Z79899 Other long term (current) drug therapy: Secondary | ICD-10-CM | POA: Diagnosis not present

## 2015-08-08 DIAGNOSIS — F329 Major depressive disorder, single episode, unspecified: Secondary | ICD-10-CM | POA: Diagnosis not present

## 2015-08-08 DIAGNOSIS — I10 Essential (primary) hypertension: Secondary | ICD-10-CM | POA: Insufficient documentation

## 2015-08-08 LAB — COMPREHENSIVE METABOLIC PANEL
ALBUMIN: 4.1 g/dL (ref 3.5–5.0)
ALT: 13 U/L — ABNORMAL LOW (ref 14–54)
ANION GAP: 6 (ref 5–15)
AST: 17 U/L (ref 15–41)
Alkaline Phosphatase: 123 U/L (ref 38–126)
BILIRUBIN TOTAL: 0.5 mg/dL (ref 0.3–1.2)
BUN: 13 mg/dL (ref 6–20)
CHLORIDE: 107 mmol/L (ref 101–111)
CO2: 27 mmol/L (ref 22–32)
Calcium: 9.2 mg/dL (ref 8.9–10.3)
Creatinine, Ser: 0.9 mg/dL (ref 0.44–1.00)
Glucose, Bld: 96 mg/dL (ref 65–99)
POTASSIUM: 3.3 mmol/L — AB (ref 3.5–5.1)
SODIUM: 140 mmol/L (ref 135–145)
TOTAL PROTEIN: 7.5 g/dL (ref 6.5–8.1)

## 2015-08-08 LAB — CBC WITH DIFFERENTIAL/PLATELET
BASOS ABS: 0 10*3/uL (ref 0.0–0.1)
Basophils Relative: 0 %
EOS PCT: 5 %
Eosinophils Absolute: 0.3 10*3/uL (ref 0.0–0.7)
HCT: 39.6 % (ref 36.0–46.0)
HEMOGLOBIN: 12.9 g/dL (ref 12.0–15.0)
LYMPHS PCT: 38 %
Lymphs Abs: 2 10*3/uL (ref 0.7–4.0)
MCH: 28.2 pg (ref 26.0–34.0)
MCHC: 32.6 g/dL (ref 30.0–36.0)
MCV: 86.5 fL (ref 78.0–100.0)
Monocytes Absolute: 0.3 10*3/uL (ref 0.1–1.0)
Monocytes Relative: 6 %
NEUTROS ABS: 2.7 10*3/uL (ref 1.7–7.7)
NEUTROS PCT: 51 %
PLATELETS: 305 10*3/uL (ref 150–400)
RBC: 4.58 MIL/uL (ref 3.87–5.11)
RDW: 13.4 % (ref 11.5–15.5)
WBC: 5.3 10*3/uL (ref 4.0–10.5)

## 2015-08-08 LAB — I-STAT TROPONIN, ED: Troponin i, poc: 0 ng/mL (ref 0.00–0.08)

## 2015-08-08 LAB — URINALYSIS, ROUTINE W REFLEX MICROSCOPIC
Bilirubin Urine: NEGATIVE
Glucose, UA: NEGATIVE mg/dL
HGB URINE DIPSTICK: NEGATIVE
Ketones, ur: NEGATIVE mg/dL
LEUKOCYTES UA: NEGATIVE
NITRITE: NEGATIVE
Protein, ur: NEGATIVE mg/dL
SPECIFIC GRAVITY, URINE: 1.02 (ref 1.005–1.030)
pH: 6 (ref 5.0–8.0)

## 2015-08-08 LAB — LIPASE, BLOOD: Lipase: 24 U/L (ref 11–51)

## 2015-08-08 MED ORDER — POTASSIUM CHLORIDE CRYS ER 20 MEQ PO TBCR
40.0000 meq | EXTENDED_RELEASE_TABLET | Freq: Once | ORAL | Status: AC
  Administered 2015-08-08: 40 meq via ORAL
  Filled 2015-08-08: qty 2

## 2015-08-08 MED ORDER — MECLIZINE HCL 25 MG PO TABS: 25.0000 mg | ORAL_TABLET | Freq: Three times a day (TID) | ORAL | Status: DC | PRN

## 2015-08-08 MED ORDER — MECLIZINE HCL 12.5 MG PO TABS
25.0000 mg | ORAL_TABLET | Freq: Once | ORAL | Status: AC
  Administered 2015-08-08: 25 mg via ORAL
  Filled 2015-08-08: qty 2

## 2015-08-08 MED ORDER — SODIUM CHLORIDE 0.9 % IV BOLUS (SEPSIS)
1000.0000 mL | Freq: Once | INTRAVENOUS | Status: AC
  Administered 2015-08-08: 1000 mL via INTRAVENOUS

## 2015-08-08 NOTE — Discharge Instructions (Signed)
Follow up with your md next week for recheck °

## 2015-08-08 NOTE — ED Notes (Signed)
Pt reports generalized weakness, nausea, and abdominal cramping especially after eating x 3 weeks.

## 2015-08-08 NOTE — ED Provider Notes (Signed)
CSN: [id number]     Arrival date & time 08/08/15  1551 History   First MD Initiated Contact with Patient 08/08/15 1711     Chief Complaint  Patient presents with  . Weakness     (Consider location/radiation/quality/duration/timing/severity/associated sxs/prior Treatment) Patient is a 60 y.o. female presenting with weakness. The history is provided by the patient (Patient complains of dizziness over week poor by mouth intake).  Weakness This is a new problem. The current episode started more than 2 days ago. The problem occurs constantly. The problem has not changed since onset.Pertinent negatives include no chest pain, no abdominal pain and no headaches. Nothing aggravates the symptoms. Nothing relieves the symptoms.    Past Medical History  Diagnosis Date  . Hypertension   . High cholesterol   . History of suicidal ideation   . Chronic back pain   . Lumbar radiculopathy   . Chronic neck pain   . Depression   . Anxiety   . Breast cancer [facility])    Past Surgical History  Procedure Laterality Date  . Cervical disc surgery    . Shoulder surgery    . Breast lumpectomy     No family history on file. Social History  Substance Use Topics  . Smoking status: Never Smoker   . Smokeless tobacco: None  . Alcohol Use: No   OB History    No data available     Review of Systems  Constitutional: Negative for appetite change and fatigue.  HENT: Negative for congestion, ear discharge and sinus pressure.   Eyes: Negative for discharge.  Respiratory: Negative for cough.   Cardiovascular: Negative for chest pain.  Gastrointestinal: Negative for abdominal pain and diarrhea.  Genitourinary: Negative for frequency and hematuria.  Musculoskeletal: Negative for back pain.  Skin: Negative for rash.  Neurological: Positive for weakness and light-headedness. Negative for seizures and headaches.  Psychiatric/Behavioral: Negative for hallucinations.      Allergies  Oxycodone; Codeine; and  Darvocet  Home Medications   Prior to Admission medications   Medication Sig Start Date End Date Taking? Authorizing Provider  acetaminophen (TYLENOL) 500 MG tablet Take 1,000 mg by mouth every 6 (six) hours as needed for mild pain.    Yes Historical Provider, MD  ALPRAZolam Duanne Moron) 0.5 MG tablet Take 0.5 mg by mouth at bedtime.   Yes Historical Provider, MD  anastrozole (ARIMIDEX) 1 MG tablet Take 1 mg by mouth every morning.    Yes Historical Provider, MD  DULoxetine (CYMBALTA) 60 MG capsule Take 60 mg by mouth 2 (two) times daily.    Yes Historical Provider, MD  gabapentin (NEURONTIN) 300 MG capsule Take 300 mg by mouth 3 (three) times daily.   Yes Historical Provider, MD  hydrochlorothiazide (HYDRODIURIL) 12.5 MG tablet Take 12.5 mg by mouth daily.   Yes Historical Provider, MD  lansoprazole (PREVACID) 30 MG capsule Take 30 mg by mouth every morning.   Yes Historical Provider, MD  meloxicam (MOBIC) 15 MG tablet Take 15 mg by mouth every morning.   Yes Historical Provider, MD  metoprolol succinate (TOPROL-XL) 50 MG 24 hr tablet Take 50 mg by mouth every morning. Take with or immediately following a meal.   Yes Historical Provider, MD  polyethylene glycol powder (GLYCOLAX/MIRALAX) powder Take 17 g by mouth 2 (two) times daily.    Yes Historical Provider, MD  ranitidine (ZANTAC) 150 MG tablet Take 150 mg by mouth at bedtime.   Yes Historical Provider, MD  sucralfate (CARAFATE) 1 g tablet  Take 1 g by mouth 4 (four) times daily.   Yes Historical Provider, MD  Vitamin D, Ergocalciferol, (DRISDOL) 50000 units CAPS capsule Take 50,000 Units by mouth 2 (two) times a week. Mondays and Thursdays   Yes Historical Provider, MD  zolpidem (AMBIEN) 5 MG tablet Take 5 mg by mouth at bedtime as needed for sleep.   Yes Historical Provider, MD  doxycycline (VIBRAMYCIN) 100 MG capsule Take 1 capsule (100 mg total) by mouth 2 (two) times daily. One po bid x 7 days Patient not taking: Reported on 08/08/2015 06/03/15    Orlie Dakin, MD  meclizine (ANTIVERT) 25 MG tablet Take 1 tablet (25 mg total) by mouth 3 (three) times daily as needed for dizziness. 08/08/15   Milton Ferguson, MD   BP 167/82 mmHg  Pulse 66  Temp(Src) 98.4 F (36.9 C) (Oral)  Resp 17  Ht 5' 5.5" (1.664 m)  Wt 195 lb (88.451 kg)  BMI 31.94 kg/m2  SpO2 100% Physical Exam  Constitutional: She is oriented to person, place, and time. She appears well-developed.  HENT:  Head: Normocephalic.  Eyes: Conjunctivae and EOM are normal. No scleral icterus.  Neck: Neck supple. No thyromegaly present.  Cardiovascular: Normal rate and regular rhythm.  Exam reveals no gallop and no friction rub.   No murmur heard. Pulmonary/Chest: No stridor. She has no wheezes. She has no rales. She exhibits no tenderness.  Abdominal: She exhibits no distension. There is no tenderness. There is no rebound.  Musculoskeletal: Normal range of motion. She exhibits no edema.  Lymphadenopathy:    She has no cervical adenopathy.  Neurological: She is oriented to person, place, and time. She exhibits normal muscle tone. Coordination normal.  Skin: No rash noted. No erythema.  Psychiatric: She has a normal mood and affect. Her behavior is normal.    ED Course  Procedures (including critical care time) Labs Review Labs Reviewed  COMPREHENSIVE METABOLIC PANEL - Abnormal; Notable for the following:    Potassium 3.3 (*)    ALT 13 (*)    All other components within normal limits  LIPASE, BLOOD  URINALYSIS, ROUTINE W REFLEX MICROSCOPIC (NOT AT [facility])  CBC WITH DIFFERENTIAL/PLATELET  I-STAT TROPOININ, ED    Imaging Review Ct Head Wo Contrast  08/08/2015  CLINICAL DATA:  Altered mental status.  History of breast carcinoma EXAM: CT HEAD WITHOUT CONTRAST TECHNIQUE: Contiguous axial images were obtained from the base of the skull through the vertex without intravenous contrast. COMPARISON:  None. FINDINGS: Brain: The ventricles are normal in size and configuration. No  intracranial mass, hemorrhage, extra-axial fluid collection, midline shift. Gray-white compartments are normal. No acute infarct evident. Vascular: There is subtle calcification in the cavernous carotid artery on the left. No hyperdense vessels are evident. Skull: Tube calvarium appears intact. Sinuses/Orbits: None no intraorbital lesions are evident. The visualized paranasal sinuses are clear. Other: Mastoid air cells are clear. IMPRESSION: Slight calcification at most prominent in the left. No intracranial mass, hemorrhage, or focal gray- white compartment lesions/acute appearing infarct. Electronically Signed   By: Lowella Grip III M.D.   On: 08/08/2015 19:16   Dg Abd Acute W/chest  08/08/2015  CLINICAL DATA:  Left upper quadrant pain following eating for 3 weeks, initial encounter EXAM: DG ABDOMEN ACUTE W/ 1V CHEST COMPARISON:  02/28/2013 FINDINGS: Cardiac shadow is within normal limits. The lungs are clear bilaterally. No acute bony abnormality is seen. Postsurgical changes in the cervical spine are noted. Scattered large and small bowel gas is seen.  No free air is identified. Mild fecal burden is noted within the right colon. No abnormal mass or abnormal calcifications are seen. No acute bony abnormality is noted. IMPRESSION: No acute abnormality seen. Electronically Signed   By: Inez Catalina M.D.   On: 08/08/2015 18:49   I have personally reviewed and evaluated these images and lab results as part of my medical decision-making.   EKG Interpretation None      MDM   Final diagnoses:  Vertigo    CT scans unremarkable labs normal. Patient refused MRI of the head. Dizziness may be related to vertigo symptoms. Patient will be placed on Antivert and will follow-up with her PCP    Milton Ferguson, MD 08/08/15 2037

## 2018-04-05 ENCOUNTER — Emergency Department (HOSPITAL_COMMUNITY)
Admission: EM | Admit: 2018-04-05 | Discharge: 2018-04-05 | Disposition: A | Discharge status: Home / Self Care | Payer: Medicare Other | Attending: Emergency Medicine | Admitting: Emergency Medicine

## 2018-04-05 ENCOUNTER — Emergency Department (HOSPITAL_COMMUNITY): Payer: Medicare Other

## 2018-04-05 ENCOUNTER — Encounter (HOSPITAL_COMMUNITY): Payer: Self-pay | Admitting: Emergency Medicine

## 2018-04-05 ENCOUNTER — Other Ambulatory Visit: Payer: Self-pay

## 2018-04-05 DIAGNOSIS — I10 Essential (primary) hypertension: Secondary | ICD-10-CM | POA: Diagnosis not present

## 2018-04-05 DIAGNOSIS — S52122A Displaced fracture of head of left radius, initial encounter for closed fracture: Secondary | ICD-10-CM | POA: Diagnosis not present

## 2018-04-05 DIAGNOSIS — Y9222 Religious institution as the place of occurrence of the external cause: Secondary | ICD-10-CM | POA: Insufficient documentation

## 2018-04-05 DIAGNOSIS — S59912A Unspecified injury of left forearm, initial encounter: Secondary | ICD-10-CM | POA: Diagnosis present

## 2018-04-05 DIAGNOSIS — Y9341 Activity, dancing: Secondary | ICD-10-CM | POA: Diagnosis not present

## 2018-04-05 DIAGNOSIS — Y999 Unspecified external cause status: Secondary | ICD-10-CM | POA: Insufficient documentation

## 2018-04-05 DIAGNOSIS — W010XXA Fall on same level from slipping, tripping and stumbling without subsequent striking against object, initial encounter: Secondary | ICD-10-CM | POA: Diagnosis not present

## 2018-04-05 MED ORDER — ACETAMINOPHEN 500 MG PO TABS
1000.0000 mg | ORAL_TABLET | Freq: Once | ORAL | Status: AC
  Administered 2018-04-05: 1000 mg via ORAL
  Filled 2018-04-05: qty 2

## 2018-04-05 NOTE — ED Triage Notes (Signed)
Patient c/o left hand/left arm pain. Per patient tripped on bottom of skirt and fell yesterday in which she tried to catch herself as she was falling backwards. Denies hitting head or LOC. Per patient pain radiates from hand to elbow.

## 2018-04-05 NOTE — ED Provider Notes (Signed)
[facility] Emergency Department Provider Note MRN:  [id number]  Arrival date & time: 04/05/18     Chief Complaint   Arm Pain   History of Present Illness   Amanda Peterson is a 63 y.o. year-old female with a history of hypertension presenting to the ED with chief complaint of arm pain.  Patient explains that she was dancing at a church function when she and her husband both stepped on her dress, causing her to fall backwards onto her arm.  Denies head trauma, no loss of consciousness, no anticoagulation, no neck pain, no chest pain, no shortness of breath, no abdominal pain, isolated left arm pain.  Patient having difficulty isolating where the pain is, endorsing significant pain to the left shoulder, elbow, forearm, wrist.  Pain is constant, worse with movement or palpation.  Review of Systems  A complete 10 system review of systems was obtained and all systems are negative except as noted in the HPI and PMH.   Patient's Health History    Past Medical History:  Diagnosis Date  . Anxiety   . Breast cancer ([place])   . Chronic back pain   . Chronic neck pain   . Depression   . High cholesterol   . History of suicidal ideation   . Hypertension   . Lumbar radiculopathy     Past Surgical History:  Procedure Laterality Date  . BACK SURGERY    . BREAST LUMPECTOMY    . CERVICAL DISC SURGERY    . SHOULDER SURGERY      History reviewed. No pertinent family history.  Social History   Socioeconomic History  . Marital status: Married    Spouse name: Not on file  . Number of children: Not on file  . Years of education: Not on file  . Highest education level: Not on file  Occupational History  . Not on file  Social Needs  . Financial resource strain: Not on file  . Food insecurity:    Worry: Not on file    Inability: Not on file  . Transportation needs:    Medical: Not on file    Non-medical: Not on file  Tobacco Use  . Smoking status: Never Smoker  .  Smokeless tobacco: Never Used  Substance and Sexual Activity  . Alcohol use: No  . Drug use: No  . Sexual activity: Not on file  Lifestyle  . Physical activity:    Days per week: Not on file    Minutes per session: Not on file  . Stress: Not on file  Relationships  . Social connections:    Talks on phone: Not on file    Gets together: Not on file    Attends religious service: Not on file    Active member of club or organization: Not on file    Attends meetings of clubs or organizations: Not on file    Relationship status: Not on file  . Intimate partner violence:    Fear of current or ex partner: Not on file    Emotionally abused: Not on file    Physically abused: Not on file    Forced sexual activity: Not on file  Other Topics Concern  . Not on file  Social History Narrative  . Not on file     Physical Exam  Vital Signs and Nursing Notes reviewed Vitals:   04/05/18 1008 04/05/18 1156  BP: (!) 147/78   Pulse: 80 64  Resp: 18   Temp:  98.2 F (36.8 C)   SpO2: 100% 99%    CONSTITUTIONAL: Well-appearing, NAD NEURO:  Alert and oriented x 3, no focal deficits EYES:  eyes equal and reactive ENT/NECK:  no LAD, no JVD CARDIO: Regular rate, well-perfused, normal S1 and S2 PULM:  CTAB no wheezing or rhonchi GI/GU:  normal bowel sounds, non-distended, non-tender MSK/SPINE:  No gross deformities, mild edema to the left elbow with significant tenderness to palpation to the elbow, distal humerus, mild tenderness to the shoulder, wrist, no snuffbox tenderness, no hand tenderness, neurovascularly intact distally. SKIN:  no rash, atraumatic PSYCH:  Appropriate speech and behavior  Diagnostic and Interventional Summary    Labs Reviewed - No data to display  DG Shoulder 1 View Left  Final Result    DG Humerus Left  Final Result    DG Elbow 2 Views Left  Final Result    DG Forearm Left  Final Result    DG Wrist Complete Left  Final Result      Medications    acetaminophen (TYLENOL) tablet 1,000 mg (1,000 mg Oral Given 04/05/18 1039)     Procedures Critical Care  ED Course and Medical Decision Making  I have reviewed the triage vital signs and the nursing notes.  Pertinent labs & imaging results that were available during my care of the patient were reviewed by me and considered in my medical decision making (see below for details).  X-rays to evaluate for fracture reveals a displaced radial head fracture.  Placed in sugar tong splint to prevent pronation supination.  This seems like the best way to help patient with her pain.  Images sent to her orthopedist, appropriate for discharge.  After the discussed management above, the patient was determined to be safe for discharge.  The patient was in agreement with this plan and all questions regarding their care were answered.  ED return precautions were discussed and the patient will return to the ED with any significant worsening of condition.   Barth Kirks. [place]onia Small, [place] [email or web address]  Final Clinical Impressions(s) / ED Diagnoses     ICD-10-CM   1. Closed displaced fracture of head of left radius, initial encounter Z99.357S     ED Discharge Orders    None         Maudie Flakes, MD 04/05/18 1244

## 2018-04-05 NOTE — Discharge Instructions (Addendum)
Keep your splint clean and dry.  You may continue taking tylenol if needed for pain.  Additionally, continue using your meloxicam.  Ice and elevation can help with pain.  Get rechecked immediately if you have any numbness, pain or swelling in your hand or fingertips or any other concerns with your splint.  Call Dr. Megan Salon and take your CD of your xrays with you to your appointment with him.

## 2018-10-01 ENCOUNTER — Encounter (HOSPITAL_COMMUNITY): Payer: Self-pay | Admitting: *Deleted

## 2018-10-01 ENCOUNTER — Other Ambulatory Visit: Payer: Self-pay

## 2018-10-01 ENCOUNTER — Emergency Department (HOSPITAL_COMMUNITY)
Admission: EM | Admit: 2018-10-01 | Discharge: 2018-10-01 | Disposition: A | Discharge status: Home / Self Care | Payer: Medicare Other | Attending: Emergency Medicine | Admitting: Emergency Medicine

## 2018-10-01 ENCOUNTER — Emergency Department (HOSPITAL_COMMUNITY): Payer: Medicare Other

## 2018-10-01 DIAGNOSIS — I10 Essential (primary) hypertension: Secondary | ICD-10-CM | POA: Insufficient documentation

## 2018-10-01 DIAGNOSIS — Z853 Personal history of malignant neoplasm of breast: Secondary | ICD-10-CM | POA: Insufficient documentation

## 2018-10-01 DIAGNOSIS — G44319 Acute post-traumatic headache, not intractable: Secondary | ICD-10-CM | POA: Diagnosis not present

## 2018-10-01 DIAGNOSIS — W010XXA Fall on same level from slipping, tripping and stumbling without subsequent striking against object, initial encounter: Secondary | ICD-10-CM | POA: Diagnosis not present

## 2018-10-01 DIAGNOSIS — R51 Headache: Secondary | ICD-10-CM | POA: Diagnosis present

## 2018-10-01 MED ORDER — ACETAMINOPHEN 500 MG PO TABS
1000.0000 mg | ORAL_TABLET | Freq: Once | ORAL | Status: AC
  Administered 2018-10-01: 1000 mg via ORAL
  Filled 2018-10-01: qty 2

## 2018-10-01 NOTE — Discharge Instructions (Addendum)
You were seen in the emergency department for headaches and balance issues after a fall striking your head.  Your CAT scan did not show any serious findings.  This is probably a concussion which will not show up on a CAT scan.  Will be important for you to follow-up with your doctor.  Tylenol for pain.  Return to the emergency department if any concerning symptoms.

## 2018-10-01 NOTE — ED Triage Notes (Signed)
Pt reports 4 days ago she fell and has had a headache since then. States she was walking and stepped in a hole and fell forward onto her face on cement. States it did not knock her out. Headache is worsening and she feels "unsteady"

## 2018-10-01 NOTE — ED Notes (Signed)
ED Provider at bedside. 

## 2018-10-01 NOTE — ED Notes (Signed)
Pt spouse to nurses station asking when pt will be seen by doctor- informed that ED Dr is tied up with critical pt at this time and will be there ASAP. Warm blanket given to pt for comfort. Pt has NAD noted.

## 2018-10-01 NOTE — ED Notes (Signed)
Patient transported to CT 

## 2018-10-01 NOTE — ED Provider Notes (Signed)
[facility] EMERGENCY DEPARTMENT Provider Note   CSN: [id number] Arrival date & time: 10/01/18  1856     History   Chief Complaint Chief Complaint  Patient presents with  . Headache    HPI Amanda Peterson is a 63 y.o. female.  She said she tripped in a hole and fell down and smacked her head on the concrete 4 days ago.  Since then she has had a headache that is been intermittently responsive to Tylenol.  She also feels unbalanced at times.  No blurry vision double vision no numbness or weakness no chest pain or shortness of breath.  No vomiting.     The history is provided by the patient.  Headache Pain location:  Frontal Quality:  Dull Radiates to:  Does not radiate Severity currently:  9/10 Severity at highest:  10/10 Onset quality:  Gradual Timing:  Intermittent Progression:  Unchanged Chronicity:  New Relieved by:  Nothing Worsened by:  Nothing Ineffective treatments:  Acetaminophen Associated symptoms: dizziness and loss of balance   Associated symptoms: no abdominal pain, no back pain, no blurred vision, no cough, no fever, no focal weakness, no numbness, no paresthesias, no photophobia, no sore throat, no visual change and no weakness     Past Medical History:  Diagnosis Date  . Anxiety   . Breast cancer ([place])   . Chronic back pain   . Chronic neck pain   . Depression   . High cholesterol   . History of suicidal ideation   . Hypertension   . Lumbar radiculopathy     There are no active problems to display for this patient.   Past Surgical History:  Procedure Laterality Date  . BACK SURGERY    . BREAST LUMPECTOMY    . CERVICAL DISC SURGERY    . SHOULDER SURGERY       OB History    Gravida  2   Para  2   Term  2   Preterm      AB      Living  2     SAB      TAB      Ectopic      Multiple      Live Births               Home Medications    Prior to Admission medications   Medication Sig Start Date End Date Taking?  Authorizing Provider  acetaminophen (TYLENOL) 500 MG tablet Take 1,000 mg by mouth every 6 (six) hours as needed for mild pain.     [provider]  Cholecalciferol (VITAMIN D3) 125 MCG (5000 UT) CAPS Take 1 capsule by mouth once a week.    [provider]  DULoxetine (CYMBALTA) 60 MG capsule Take 60 mg by mouth daily.     [provider]  gabapentin (NEURONTIN) 300 MG capsule Take 300 mg by mouth 3 (three) times daily.    [provider]  hydrochlorothiazide (HYDRODIURIL) 12.5 MG tablet Take 12.5 mg by mouth daily.    [provider]  levocetirizine (XYZAL) 5 MG tablet Take 5 mg by mouth daily.    [provider]  meloxicam (MOBIC) 15 MG tablet Take 15 mg by mouth every morning.    [provider]  metoprolol succinate (TOPROL-XL) 50 MG 24 hr tablet Take 50 mg by mouth every morning. Take with or immediately following a meal.    [provider]  sucralfate (CARAFATE) 1 g tablet Take  1 g by mouth 2 (two) times daily.     [provider]  zolpidem (AMBIEN) 5 MG tablet Take 5 mg by mouth at bedtime as needed for sleep.    [provider]    Family History No family history on file.  Social History Social History   Tobacco Use  . Smoking status: Never Smoker  . Smokeless tobacco: Never Used  Substance Use Topics  . Alcohol use: No  . Drug use: No     Allergies   Oxycodone, Codeine, and Darvocet [propoxyphene n-acetaminophen]   Review of Systems Review of Systems  Constitutional: Negative for fever.  HENT: Negative for sore throat.   Eyes: Negative for blurred vision and photophobia.  Respiratory: Negative for cough and shortness of breath.   Cardiovascular: Negative for chest pain.  Gastrointestinal: Negative for abdominal pain.  Genitourinary: Negative for dysuria.  Musculoskeletal: Negative for back pain.  Skin: Negative for rash.  Neurological: Positive for dizziness, headaches and  loss of balance. Negative for focal weakness, weakness, numbness and paresthesias.     Physical Exam Updated Vital Signs BP 138/76   Pulse 63   Temp 98.2 F (36.8 C) (Oral)   Resp 18   Ht 5' 5.5" (1.664 m)   Wt 86.6 kg   SpO2 97%   BMI 31.30 kg/m   Physical Exam Vitals signs and nursing note reviewed.  Constitutional:      General: She is not in acute distress.    Appearance: She is well-developed.  HENT:     Head: Normocephalic and atraumatic.     Mouth/Throat:     Mouth: Mucous membranes are moist.     Pharynx: Oropharynx is clear.  Eyes:     Extraocular Movements: Extraocular movements intact.     Conjunctiva/sclera: Conjunctivae normal.     Pupils: Pupils are equal, round, and reactive to light.  Neck:     Musculoskeletal: Neck supple.  Cardiovascular:     Rate and Rhythm: Normal rate and regular rhythm.     Heart sounds: No murmur.  Pulmonary:     Effort: Pulmonary effort is normal. No respiratory distress.     Breath sounds: Normal breath sounds.  Abdominal:     Palpations: Abdomen is soft.     Tenderness: There is no abdominal tenderness.  Musculoskeletal: Normal range of motion.     Right lower leg: No edema.     Left lower leg: No edema.  Skin:    General: Skin is warm and dry.     Capillary Refill: Capillary refill takes less than 2 seconds.  Neurological:     Mental Status: She is alert.     GCS: GCS eye subscore is 4. GCS verbal subscore is 5. GCS motor subscore is 6.     Cranial Nerves: No cranial nerve deficit or dysarthria.     Sensory: No sensory deficit.     Motor: No weakness.     Gait: Gait normal.      ED Treatments / Results  Labs (all labs ordered are listed, but only abnormal results are displayed) Labs Reviewed - No data to display  EKG None  Radiology Ct Head Wo Contrast  Result Date: 10/01/2018 CLINICAL DATA:  Posttraumatic headache after fall 4 days ago onto cement. EXAM: CT HEAD WITHOUT CONTRAST TECHNIQUE: Contiguous  axial images were obtained from the base of the skull through the vertex without intravenous contrast. COMPARISON:  Head CT 08/08/2015 FINDINGS: Brain: Brain volume is normal for  age. No evidence of acute infarction, hemorrhage, hydrocephalus, extra-axial collection or mass lesion/mass effect. Vascular: No hyperdense vessel. Skull: No fracture or focal lesion. Sinuses/Orbits: Paranasal sinuses and mastoid air cells are clear. The visualized orbits are unremarkable. Other: None. IMPRESSION: Negative head CT. Electronically Signed   By: Keith Rake M.D.   On: 10/01/2018 22:35    Procedures Procedures (including critical care time)  Medications Ordered in ED Medications  acetaminophen (TYLENOL) tablet 1,000 mg (has no administration in time range)     Initial Impression / Assessment and Plan / ED Course  I have reviewed the triage vital signs and the nursing notes.  Pertinent labs & imaging results that were available during my care of the patient were reviewed by me and considered in my medical decision making (see chart for details).  Clinical Course as of Oct 02 1042  Thu Oct 01, 6071  [id number] 63 year old female here with a headache since a fall striking her head.  Differential includes concussion, head bleed, skull fracture.  She has a normal neuro exam and otherwise benign physical exam.  Vitals are unremarkable.  Giving her some Tylenol for pain because she says her headache is recurred and ordering a CT head.   [MB]  2237 CT head negative.   [MB]    Clinical Course User Index [MB] Hayden Rasmussen, MD         Final Clinical Impressions(s) / ED Diagnoses   Final diagnoses:  Acute post-traumatic headache, not intractable    ED Discharge Orders    None       Hayden Rasmussen, MD 10/02/18 1044

## 2020-06-11 ENCOUNTER — Emergency Department (HOSPITAL_COMMUNITY)
Admission: EM | Admit: 2020-06-11 | Discharge: 2020-06-11 | Disposition: A | Discharge status: Home / Self Care | Payer: Medicare Other | Attending: Emergency Medicine | Admitting: Emergency Medicine

## 2020-06-11 ENCOUNTER — Emergency Department (HOSPITAL_COMMUNITY): Payer: Medicare Other

## 2020-06-11 ENCOUNTER — Encounter (HOSPITAL_COMMUNITY): Payer: Self-pay | Admitting: *Deleted

## 2020-06-11 ENCOUNTER — Other Ambulatory Visit: Payer: Self-pay

## 2020-06-11 DIAGNOSIS — R5383 Other fatigue: Secondary | ICD-10-CM | POA: Insufficient documentation

## 2020-06-11 DIAGNOSIS — Z853 Personal history of malignant neoplasm of breast: Secondary | ICD-10-CM | POA: Insufficient documentation

## 2020-06-11 DIAGNOSIS — Z79899 Other long term (current) drug therapy: Secondary | ICD-10-CM | POA: Diagnosis not present

## 2020-06-11 DIAGNOSIS — R630 Anorexia: Secondary | ICD-10-CM | POA: Insufficient documentation

## 2020-06-11 DIAGNOSIS — Z20822 Contact with and (suspected) exposure to covid-19: Secondary | ICD-10-CM | POA: Diagnosis not present

## 2020-06-11 DIAGNOSIS — R600 Localized edema: Secondary | ICD-10-CM | POA: Insufficient documentation

## 2020-06-11 DIAGNOSIS — R109 Unspecified abdominal pain: Secondary | ICD-10-CM | POA: Insufficient documentation

## 2020-06-11 DIAGNOSIS — I10 Essential (primary) hypertension: Secondary | ICD-10-CM | POA: Diagnosis not present

## 2020-06-11 DIAGNOSIS — M791 Myalgia, unspecified site: Secondary | ICD-10-CM | POA: Diagnosis not present

## 2020-06-11 LAB — URINALYSIS, ROUTINE W REFLEX MICROSCOPIC
Bilirubin Urine: NEGATIVE
Glucose, UA: NEGATIVE mg/dL
Hgb urine dipstick: NEGATIVE
Ketones, ur: NEGATIVE mg/dL
Leukocytes,Ua: NEGATIVE
Nitrite: NEGATIVE
Protein, ur: NEGATIVE mg/dL
Specific Gravity, Urine: 1.013 (ref 1.005–1.030)
pH: 7 (ref 5.0–8.0)

## 2020-06-11 LAB — CBC WITH DIFFERENTIAL/PLATELET
Abs Immature Granulocytes: 0.01 10*3/uL (ref 0.00–0.07)
Basophils Absolute: 0.1 10*3/uL (ref 0.0–0.1)
Basophils Relative: 1 %
Eosinophils Absolute: 0.4 10*3/uL (ref 0.0–0.5)
Eosinophils Relative: 7 %
HCT: 40.5 % (ref 36.0–46.0)
Hemoglobin: 13 g/dL (ref 12.0–15.0)
Immature Granulocytes: 0 %
Lymphocytes Relative: 36 %
Lymphs Abs: 2.1 10*3/uL (ref 0.7–4.0)
MCH: 28.7 pg (ref 26.0–34.0)
MCHC: 32.1 g/dL (ref 30.0–36.0)
MCV: 89.4 fL (ref 80.0–100.0)
Monocytes Absolute: 0.4 10*3/uL (ref 0.1–1.0)
Monocytes Relative: 7 %
Neutro Abs: 2.9 10*3/uL (ref 1.7–7.7)
Neutrophils Relative %: 49 %
Platelets: 333 10*3/uL (ref 150–400)
RBC: 4.53 MIL/uL (ref 3.87–5.11)
RDW: 13.4 % (ref 11.5–15.5)
WBC: 5.8 10*3/uL (ref 4.0–10.5)
nRBC: 0 % (ref 0.0–0.2)

## 2020-06-11 LAB — COMPREHENSIVE METABOLIC PANEL
ALT: 10 U/L (ref 0–44)
AST: 15 U/L (ref 15–41)
Albumin: 3.7 g/dL (ref 3.5–5.0)
Alkaline Phosphatase: 94 U/L (ref 38–126)
Anion gap: 5 (ref 5–15)
BUN: 9 mg/dL (ref 8–23)
CO2: 28 mmol/L (ref 22–32)
Calcium: 9.3 mg/dL (ref 8.9–10.3)
Chloride: 106 mmol/L (ref 98–111)
Creatinine, Ser: 1 mg/dL (ref 0.44–1.00)
GFR, Estimated: >60 mL/min (ref >60)
Glucose, Bld: 96 mg/dL (ref 70–99)
Potassium: 3.4 mmol/L — ABNORMAL LOW (ref 3.5–5.1)
Sodium: 139 mmol/L (ref 135–145)
Total Bilirubin: 0.5 mg/dL (ref 0.3–1.2)
Total Protein: 7.2 g/dL (ref 6.5–8.1)

## 2020-06-11 LAB — LIPASE, BLOOD: Lipase: 34 U/L (ref 11–51)

## 2020-06-11 LAB — RESP PANEL BY RT-PCR (FLU A&B, COVID) ARPGX2
Influenza A by PCR: NEGATIVE
Influenza B by PCR: NEGATIVE
SARS Coronavirus 2 by RT PCR: NEGATIVE

## 2020-06-11 MED ORDER — SODIUM CHLORIDE 0.9 % IV BOLUS
1000.0000 mL | Freq: Once | INTRAVENOUS | Status: AC
  Administered 2020-06-11: 1000 mL via INTRAVENOUS

## 2020-06-11 MED ORDER — SODIUM CHLORIDE 0.9 % IV SOLN: INTRAVENOUS | Status: DC

## 2020-06-11 NOTE — Discharge Instructions (Signed)
Work-up without any acute findings.  Would recommend following back up with your doctor to have thyroid function test done.  COVID testing negative influenza testing negative labs without any significant abnormality.  Chest x-ray without any acute findings.

## 2020-06-11 NOTE — ED Provider Notes (Signed)
[facility] Provider Note   CSN: [id number] Arrival date & time: 06/11/20  1615     History Chief Complaint  Patient presents with  . Weakness    Generalized     Amanda Peterson is a 65 y.o. female.  Patient not feeling well for 2 weeks.  Patient's and concerned about having COVID.  Patient had a COVID test done on Thursday but has not received results.  Patient has had the primary vaccine series has not had a booster.  Patient's complaint is congestion some right-sided abdominal pain myalgias fatigue decreased appetite.  Denies any chest pain shortness of breath nausea vomiting or diarrhea.  Denies any dysuria.  Patient does states she does have a lot of bilateral lower extremity leg edema but that has been going on for a while.  Patient's husband is not sick.  Patient says that she has been told she was borderline diabetic.        Past Medical History:  Diagnosis Date  . Anxiety   . Breast cancer ([place])   . Chronic back pain   . Chronic neck pain   . Depression   . High cholesterol   . History of suicidal ideation   . Hypertension   . Lumbar radiculopathy     There are no problems to display for this patient.   Past Surgical History:  Procedure Laterality Date  . BACK SURGERY    . BREAST LUMPECTOMY    . CERVICAL DISC SURGERY    . SHOULDER SURGERY    . THYROID SURGERY Right      OB History    Gravida  2   Para  2   Term  2   Preterm      AB      Living  2     SAB      IAB      Ectopic      Multiple      Live Births              History reviewed. No pertinent family history.  Social History   Tobacco Use  . Smoking status: Never Smoker  . Smokeless tobacco: Never Used  Vaping Use  . Vaping Use: Never used  Substance Use Topics  . Alcohol use: No  . Drug use: No    Home Medications Prior to Admission medications   Medication Sig Start Date End Date Taking? Authorizing Provider  acetaminophen (TYLENOL) 500  MG tablet Take 1,000 mg by mouth every 6 (six) hours as needed for mild pain.    Yes [provider]  bisacodyl (BISACODYL) 5 MG EC tablet Take 5 mg by mouth daily as needed for moderate constipation.   Yes [provider]  DULoxetine (CYMBALTA) 60 MG capsule Take 60 mg by mouth daily.    Yes [provider]  ezetimibe (ZETIA) 10 MG tablet Take 10 mg by mouth daily. 05/16/20  Yes [provider]  gabapentin (NEURONTIN) 300 MG capsule Take 300 mg by mouth 3 (three) times daily.   Yes [provider]  hydrochlorothiazide (HYDRODIURIL) 12.5 MG tablet Take 12.5 mg by mouth daily.   Yes [provider]  levocetirizine (XYZAL) 5 MG tablet Take 5 mg by mouth daily.   Yes [provider]  meloxicam (MOBIC) 15 MG tablet Take 15 mg by mouth every morning.   Yes [provider]  metoprolol succinate (TOPROL-XL) 50 MG 24 hr tablet Take 50 mg by mouth  every morning. Take with or immediately following a meal.   Yes [provider]  sucralfate (CARAFATE) 1 g tablet Take 1 g by mouth 2 (two) times daily.    Yes [provider]  zolpidem (AMBIEN) 5 MG tablet Take 5 mg by mouth at bedtime as needed for sleep.   Yes [provider]  Cholecalciferol (VITAMIN D3) 125 MCG (5000 UT) CAPS Take 1 capsule by mouth once a week. Patient not taking: Reported on 06/11/2020    [provider]    Allergies    Oxycodone, Codeine, and Darvocet [propoxyphene n-acetaminophen]  Review of Systems   Review of Systems  Constitutional: Positive for appetite change and fatigue. Negative for chills and fever.  HENT: Positive for congestion. Negative for rhinorrhea and sore throat.   Eyes: Negative for visual disturbance.  Respiratory: Negative for cough and shortness of breath.   Cardiovascular: Negative for chest pain and leg swelling.  Gastrointestinal: Positive for abdominal pain. Negative for diarrhea, nausea and vomiting.   Genitourinary: Negative for dysuria.  Musculoskeletal: Positive for myalgias. Negative for back pain and neck pain.  Skin: Negative for rash.  Neurological: Negative for dizziness, light-headedness and headaches.  Hematological: Does not bruise/bleed easily.  Psychiatric/Behavioral: Negative for confusion.    Physical Exam Updated Vital Signs BP 129/67   Pulse (!) 56   Temp 98.4 F (36.9 C) (Oral)   Resp 16   Ht 1.664 m (5' 5.5")   Wt 90.3 kg   SpO2 100%   BMI 32.61 kg/m   Physical Exam Vitals and nursing note reviewed.  Constitutional:      General: She is not in acute distress.    Appearance: Normal appearance. She is well-developed.  HENT:     Head: Normocephalic and atraumatic.     Mouth/Throat:     Mouth: Mucous membranes are moist.  Eyes:     Extraocular Movements: Extraocular movements intact.     Conjunctiva/sclera: Conjunctivae normal.     Pupils: Pupils are equal, round, and reactive to light.  Cardiovascular:     Rate and Rhythm: Normal rate and regular rhythm.     Heart sounds: No murmur heard.   Pulmonary:     Effort: Pulmonary effort is normal. No respiratory distress.     Breath sounds: Normal breath sounds.  Abdominal:     Palpations: Abdomen is soft.     Tenderness: There is abdominal tenderness.     Comments: Slight tenderness to palpation right side of abdomen.  More so right lower quadrant.  No guarding.  Musculoskeletal:        General: Swelling present. Normal range of motion.     Cervical back: Normal range of motion and neck supple.     Right lower leg: Edema present.     Left lower leg: Edema present.  Skin:    General: Skin is warm and dry.  Neurological:     General: No focal deficit present.     Mental Status: She is alert and oriented to person, place, and time.     Cranial Nerves: No cranial nerve deficit.     Sensory: No sensory deficit.     Motor: No weakness.     ED Results / Procedures / Treatments   Labs (all labs  ordered are listed, but only abnormal results are displayed) Labs Reviewed  COMPREHENSIVE METABOLIC PANEL - Abnormal; Notable for the following components:      Result Value   Potassium 3.4 (*)  All other components within normal limits  URINALYSIS, ROUTINE W REFLEX MICROSCOPIC - Abnormal; Notable for the following components:   APPearance CLOUDY (*)    All other components within normal limits  RESP PANEL BY RT-PCR (FLU A&B, COVID) ARPGX2  LIPASE, BLOOD  CBC WITH DIFFERENTIAL/PLATELET    EKG EKG Interpretation  Date/Time:  Sunday Jun 11 2020 17:35:13 EDT Ventricular Rate:  54 PR Interval:  149 QRS Duration: 85 QT Interval:  486 QTC Calculation: 461 R Axis:   66 Text Interpretation: Sinus rhythm Confirmed by Fredia Sorrow [phone]) on 06/11/2020 5:39:25 PM   Radiology DG Chest Port 1 View  Result Date: 06/11/2020 CLINICAL DATA:  Initial evaluation for acute congestion. EXAM: PORTABLE CHEST 1 VIEW COMPARISON:  Prior radiograph from 02/28/2013. FINDINGS: Transverse heart size within normal limits. Mediastinal silhouette normal. Tortuosity the intrathoracic aorta. Lungs hypoinflated with elevation of the right hemidiaphragm. Secondary mild bibasilar bronchovascular crowding and/or subsegmental atelectasis. No focal infiltrates or consolidative airspace disease. No pulmonary edema or pleural effusion. No pneumothorax. No acute osseous finding.  Cervical ACDF noted. IMPRESSION: 1. Shallow lung inflation with associated mild bibasilar bronchovascular crowding and/or subsegmental atelectasis. 2. No other active cardiopulmonary disease. Electronically Signed   By: Jeannine Boga M.D.   On: 06/11/2020 18:56    Procedures Procedures   Medications Ordered in ED Medications  0.9 %  sodium chloride infusion ( Intravenous New Bag/Given 06/11/20 1828)  sodium chloride 0.9 % bolus 1,000 mL (0 mLs Intravenous Stopped 06/11/20 1827)    ED Course  I have reviewed the triage vital signs  and the nursing notes.  Pertinent labs & imaging results that were available during my care of the patient were reviewed by me and considered in my medical decision making (see chart for details).    MDM Rules/Calculators/A&P                         Work-up without any acute findings.  Electrolytes and liver function tests normal no leukocytosis no anemia.  COVID testing influenza testing negative.  Urine not consistent with urinary tract infection.  And chest x-ray without any acute findings.  Would recommend following back up with primary care doctor to have thyroid function test done just to make sure you are not hypothyroid.   Final Clinical Impression(s) / ED Diagnoses Final diagnoses:  Fatigue, unspecified type    Rx / DC Orders ED Discharge Orders    None       Fredia Sorrow, MD 06/11/20 1921

## 2020-06-11 NOTE — ED Notes (Signed)
Pt pt on a 5 lead monitor and advised needed urine

## 2020-06-11 NOTE — ED Triage Notes (Signed)
Pt c/o generalized weakness with chills and body aches for the last 2 weeks

## 2021-04-16 ENCOUNTER — Encounter (HOSPITAL_COMMUNITY): Payer: Self-pay | Admitting: *Deleted

## 2021-04-16 ENCOUNTER — Emergency Department (HOSPITAL_COMMUNITY)
Admission: EM | Admit: 2021-04-16 | Discharge: 2021-04-16 | Disposition: A | Discharge status: Home / Self Care | Payer: Medicare Other | Attending: Emergency Medicine | Admitting: Emergency Medicine

## 2021-04-16 ENCOUNTER — Emergency Department (HOSPITAL_COMMUNITY): Payer: Medicare Other

## 2021-04-16 DIAGNOSIS — Z7901 Long term (current) use of anticoagulants: Secondary | ICD-10-CM | POA: Insufficient documentation

## 2021-04-16 DIAGNOSIS — M62838 Other muscle spasm: Secondary | ICD-10-CM | POA: Insufficient documentation

## 2021-04-16 DIAGNOSIS — M25551 Pain in right hip: Secondary | ICD-10-CM

## 2021-04-16 DIAGNOSIS — R202 Paresthesia of skin: Secondary | ICD-10-CM | POA: Diagnosis not present

## 2021-04-16 LAB — CBC WITH DIFFERENTIAL/PLATELET
Abs Immature Granulocytes: 0.02 10*3/uL (ref 0.00–0.07)
Basophils Absolute: 0 10*3/uL (ref 0.0–0.1)
Basophils Relative: 0 %
Eosinophils Absolute: 0.1 10*3/uL (ref 0.0–0.5)
Eosinophils Relative: 1 %
HCT: 39.7 % (ref 36.0–46.0)
Hemoglobin: 12.4 g/dL (ref 12.0–15.0)
Immature Granulocytes: 0 %
Lymphocytes Relative: 30 %
Lymphs Abs: 1.9 10*3/uL (ref 0.7–4.0)
MCH: 28.7 pg (ref 26.0–34.0)
MCHC: 31.2 g/dL (ref 30.0–36.0)
MCV: 91.9 fL (ref 80.0–100.0)
Monocytes Absolute: 0.4 10*3/uL (ref 0.1–1.0)
Monocytes Relative: 7 %
Neutro Abs: 3.8 10*3/uL (ref 1.7–7.7)
Neutrophils Relative %: 62 %
Platelets: 386 10*3/uL (ref 150–400)
RBC: 4.32 MIL/uL (ref 3.87–5.11)
RDW: 14.2 % (ref 11.5–15.5)
WBC: 6.2 10*3/uL (ref 4.0–10.5)
nRBC: 0 % (ref 0.0–0.2)

## 2021-04-16 LAB — URINALYSIS, ROUTINE W REFLEX MICROSCOPIC
Bilirubin Urine: NEGATIVE
Glucose, UA: NEGATIVE mg/dL
Hgb urine dipstick: NEGATIVE
Ketones, ur: NEGATIVE mg/dL
Leukocytes,Ua: NEGATIVE
Nitrite: NEGATIVE
Protein, ur: NEGATIVE mg/dL
Specific Gravity, Urine: 1.011 (ref 1.005–1.030)
pH: 5 (ref 5.0–8.0)

## 2021-04-16 LAB — BASIC METABOLIC PANEL
Anion gap: 7 (ref 5–15)
BUN: 14 mg/dL (ref 8–23)
CO2: 28 mmol/L (ref 22–32)
Calcium: 9.1 mg/dL (ref 8.9–10.3)
Chloride: 104 mmol/L (ref 98–111)
Creatinine, Ser: 0.93 mg/dL (ref 0.44–1.00)
GFR, Estimated: >60 mL/min (ref >60)
Glucose, Bld: 91 mg/dL (ref 70–99)
Potassium: 4.6 mmol/L (ref 3.5–5.1)
Sodium: 139 mmol/L (ref 135–145)

## 2021-04-16 LAB — MAGNESIUM: Magnesium: 2.4 mg/dL (ref 1.7–2.4)

## 2021-04-16 LAB — CK: Total CK: 130 U/L (ref 38–234)

## 2021-04-16 NOTE — Discharge Instructions (Addendum)
Your lab test and x-ray along with your exam are reassuring today.  However as discussed I think you do have either muscle or tendon strain deep in your right hip.  I do recommend applying application of heat to the site 20 minutes several times daily.  Try to avoid any activities that worsen this pain but stay as active as you comfortably can.  I do think he would benefit from a muscle relaxer or an anti-inflammatory or both, but I understand you do not want to take any medications.  Please call your orthopedist Dr. Megan Salon in [place] for further evaluation and management of this pain. ?

## 2021-04-16 NOTE — ED Triage Notes (Signed)
Pain in entire right leg x 2 weeks with numbness in toes ?

## 2021-04-16 NOTE — ED Provider Notes (Signed)
?[facility] ?Provider Note ? ? ?CSN: [id number] ?Arrival date & time: 04/16/21  1031 ? ?  ? ?History ? ?Chief Complaint  ?Patient presents with  ? Leg Pain  ? ? ?Amanda Peterson is a 66 y.o. female with past medical history of lumbar radiculopathy improved by surgical intervention last year presenting with complaint of right hip and buttock pain worsened with movement and weight bearing with radiation to her posteriorl thigh, described as a pulling sensation with movement.  She also reports night time muscle cramping in her bilateral legs, frequently having to stand and walk around to get the spasms to stop.  She has a history of leg claudication and had a surgical procedure to improve the blockage in her right leg (describes what sounds like balloon dilation), is awaiting similar treatment of the left.  She reports numbness in her toes which is intermittent without triggers.  She denies foot or toe pain and has had no urinary or fecal incontinence or retention.  She is on a diuretic but endorses taking potassium supplementation.  She denies injury or falls.  Her symptoms have been present for several weeks and she has found no alleviators for her symptoms. ? ?The history is provided by the patient.  ? ?  ? ?Home Medications ?Prior to Admission medications   ?Medication Sig Start Date End Date Taking? Authorizing Provider  ?glipiZIDE (GLUCOTROL XL) 2.5 MG 24 hr tablet Take 2.5 mg by mouth daily with breakfast.   Yes [provider]  ?rivaroxaban (XARELTO) 10 MG TABS tablet Take 10 mg by mouth daily.   Yes [provider]  ?acetaminophen (TYLENOL) 500 MG tablet Take 1,000 mg by mouth every 6 (six) hours as needed for mild pain.     [provider]  ?bisacodyl (BISACODYL) 5 MG EC tablet Take 5 mg by mouth daily as needed for moderate constipation.    [provider]  ?Cholecalciferol (VITAMIN D3) 125 MCG (5000 UT) CAPS Take 1 capsule by mouth once a week. ?Patient  not taking: Reported on 06/11/2020    [provider]  ?DULoxetine (CYMBALTA) 60 MG capsule Take 60 mg by mouth daily.     [provider]  ?ezetimibe (ZETIA) 10 MG tablet Take 10 mg by mouth daily. 05/16/20   [provider]  ?gabapentin (NEURONTIN) 300 MG capsule Take 300 mg by mouth 3 (three) times daily.    [provider]  ?hydrochlorothiazide (HYDRODIURIL) 12.5 MG tablet Take 12.5 mg by mouth daily.    [provider]  ?levocetirizine (XYZAL) 5 MG tablet Take 5 mg by mouth daily.    [provider]  ?meloxicam (MOBIC) 15 MG tablet Take 15 mg by mouth every morning.    [provider]  ?metoprolol succinate (TOPROL-XL) 50 MG 24 hr tablet Take 50 mg by mouth every morning. Take with or immediately following a meal.    [provider]  ?sucralfate (CARAFATE) 1 g tablet Take 1 g by mouth 2 (two) times daily.     [provider]  ?zolpidem (AMBIEN) 5 MG tablet Take 5 mg by mouth at bedtime as needed for sleep.    [provider]  ?   ? ?Allergies    ?Oxycodone, Codeine, and Darvocet [propoxyphene n-acetaminophen]   ? ?Review of Systems   ?Review of Systems  ?Constitutional:  Negative for fever.  ?HENT:  Negative for congestion and sore throat.   ?Eyes: Negative.   ?Respiratory:  Negative for chest tightness  and shortness of breath.   ?Cardiovascular:  Negative for chest pain and leg swelling.  ?Gastrointestinal:  Negative for abdominal pain, nausea and vomiting.  ?Genitourinary: Negative.   ?Musculoskeletal:  Positive for arthralgias. Negative for joint swelling and neck pain.  ?     Muscle cramps  ?Skin: Negative.  Negative for color change, rash and wound.  ?Neurological:  Positive for numbness. Negative for dizziness, weakness, light-headedness and headaches.  ?Psychiatric/Behavioral: Negative.    ?All other systems reviewed and are negative. ? ?Physical Exam ?Updated Vital Signs ?BP 139/78   Pulse 65   Temp 98.6 ?F (37 ?C)  (Oral)   Resp 18   Ht 5' 5.5" (1.664 m)   Wt 89.8 kg   SpO2 97%   BMI 32.45 kg/m?  ?Physical Exam ?Vitals and nursing note reviewed.  ?Constitutional:   ?   Appearance: She is well-developed.  ?HENT:  ?   Head: Normocephalic and atraumatic.  ?Eyes:  ?   Conjunctiva/sclera: Conjunctivae normal.  ?Cardiovascular:  ?   Rate and Rhythm: Normal rate and regular rhythm.  ?   Pulses:     ?     Dorsalis pedis pulses are 2+ on the right side and 2+ on the left side.  ?   Heart sounds: Normal heart sounds.  ?Pulmonary:  ?   Effort: Pulmonary effort is normal.  ?   Breath sounds: Normal breath sounds. No wheezing.  ?Abdominal:  ?   General: Bowel sounds are normal.  ?   Palpations: Abdomen is soft.  ?   Tenderness: There is no abdominal tenderness.  ?Musculoskeletal:     ?   General: Normal range of motion.  ?   Cervical back: Normal range of motion.  ?   Lumbar back: Normal. No tenderness.  ?   Right hip: No bony tenderness or crepitus. Normal range of motion. Normal strength.  ?   Right lower leg: No swelling or tenderness. No edema.  ?   Left lower leg: No edema.  ?   Right foot: Normal capillary refill. No swelling or deformity. Normal pulse.  ?   Left foot: Normal capillary refill. No swelling or deformity. Normal pulse.  ?   Comments: Pain is reproduced deep in buttock region (right) with active hip extension. No reproducible with palpation.  ?Skin: ?   General: Skin is warm and dry.  ?Neurological:  ?   Mental Status: She is alert.  ? ? ?ED Results / Procedures / Treatments   ?Labs ?(all labs ordered are listed, but only abnormal results are displayed) ?Labs Reviewed  ?URINALYSIS, ROUTINE W REFLEX MICROSCOPIC - Abnormal; Notable for the following components:  ?    Result Value  ? APPearance HAZY (*)   ? All other components within normal limits  ?BASIC METABOLIC PANEL  ?CBC WITH DIFFERENTIAL/PLATELET  ?MAGNESIUM  ?CK  ? ? ?EKG ?None ? ?Radiology ?DG Hip Unilat W or Wo Pelvis 2-3 Views Right ? ?Result Date:  04/16/2021 ?CLINICAL DATA:  Right hip pain and numbness for 2 weeks.  No injury. EXAM: DG HIP (WITH OR WITHOUT PELVIS) 2-3V RIGHT COMPARISON:  None. FINDINGS: AP view of the pelvis and AP/frog leg views of the right hip. No acute fracture or dislocation. Joint spaces maintained. Sacroiliac joints are symmetric. IMPRESSION: No acute osseous abnormality. Electronically Signed   By: Abigail Miyamoto M.D.   On: 04/16/2021 13:52   ? ?Procedures ?Procedures  ? ? ?Medications Ordered in ED ?Medications - No data to  display ? ?ED Course/ Medical Decision Making/ A&P ?  ?                        ?Medical Decision Making ?Pt with nocturnal muscle spasms in lower legs with chronic diuretic use, concern for possible electrolyte abnormality, dehydration, RLS.  Right deep hip pain radiating to her lower thigh, triggered by hip extension, suggesting deep hip extensor strain.   Pt does have PVD by hx, but cramping occurring at rest,  not c/w claudication. ? ?Amount and/or Complexity of Data Reviewed ?Labs: ordered. ?   Details: labs reviewed with no electrolyte abnormalities. ?Radiology: ordered. ?   Details: right hip negative for arthritis or bony lesions. ? ?Risk ?OTC drugs. ?Prescription drug management. ?Diagnosis or treatment significantly limited by social determinants of health. ?Risk Details: Treatment plan significantly limited as pt was unwilling to try any medications for sx improvement/relief.  She was advised to increase fluid intake to minimize risk that dehydration is source of cramping.  She has ortho in [place], Dr. Megan Salon, advised to f/u with him for further management if sx persist.  Also discussed heat tx to buttuck/hip region.  ? ? ? ? ? ? ? ? ? ? ?Final Clinical Impression(s) / ED Diagnoses ?Final diagnoses:  ?Right hip pain  ?Muscle spasm of both lower legs  ? ? ?Rx / DC Orders ?ED Discharge Orders   ? ? None  ? ?  ? ? ?  ?Evalee Jefferson, PA-C ?04/17/21 1854 ? ?  ?Luna Fuse, MD ?04/27/21 1648 ? ?

## 2021-07-22 ENCOUNTER — Other Ambulatory Visit: Payer: Self-pay

## 2021-07-22 ENCOUNTER — Encounter (HOSPITAL_COMMUNITY): Payer: Self-pay | Admitting: *Deleted

## 2021-07-22 ENCOUNTER — Emergency Department (HOSPITAL_COMMUNITY)
Admission: EM | Admit: 2021-07-22 | Discharge: 2021-07-22 | Disposition: A | Discharge status: Home / Self Care | Payer: Medicare Other | Attending: Emergency Medicine | Admitting: Emergency Medicine

## 2021-07-22 DIAGNOSIS — R443 Hallucinations, unspecified: Secondary | ICD-10-CM | POA: Diagnosis not present

## 2021-07-22 DIAGNOSIS — R404 Transient alteration of awareness: Secondary | ICD-10-CM | POA: Insufficient documentation

## 2021-07-22 DIAGNOSIS — Z7901 Long term (current) use of anticoagulants: Secondary | ICD-10-CM | POA: Diagnosis not present

## 2021-07-22 DIAGNOSIS — R4182 Altered mental status, unspecified: Secondary | ICD-10-CM | POA: Diagnosis present

## 2021-07-22 LAB — CBG MONITORING, ED: Glucose-Capillary: 113 mg/dL — ABNORMAL HIGH (ref 70–99)

## 2021-07-22 MED ORDER — LORAZEPAM 2 MG/ML IJ SOLN
2.0000 mg | Freq: Once | INTRAMUSCULAR | Status: DC
  Filled 2021-07-22: qty 1

## 2021-07-22 MED ORDER — LORAZEPAM 2 MG/ML IJ SOLN
2.0000 mg | Freq: Once | INTRAMUSCULAR | Status: AC
  Administered 2021-07-22: 2 mg via INTRAVENOUS

## 2021-07-22 MED ORDER — LORAZEPAM 1 MG PO TABS: 1.0000 mg | ORAL_TABLET | Freq: Three times a day (TID) | ORAL | 0 refills | Status: DC | PRN

## 2021-07-22 NOTE — ED Notes (Signed)
Pt is more calm at this time with family at bedside. Will continue to monitor

## 2021-07-22 NOTE — ED Provider Notes (Signed)
[facility] EMERGENCY DEPARTMENT Provider Note   CSN: [id number] Arrival date & time: 07/22/21  1308     History  Chief Complaint  Patient presents with   Altered Mental Status    Amanda Peterson is a 66 y.o. female with medical history of anxiety, depression, suicidal ideation, psychiatric institutionalization 5 to 6 years ago per husband.  The patient presents to ED for evaluation of altered mental status.  Per patient husband at bedside, the patient woke up this morning in her usual state of health.  The patient and her husband went to church and after the patient gave her testimony, she began screaming, yelling and acting erratically.  Patient states that she remembers this occurring, states that at that time she was hallucinating and seeing snakes that were "attacking her". Patient has psychiatric history, has been institutionalized in the last 6 years.  Per patient husband, patient is supposed to take Xanax once daily for sleep.  This is also confirmed by granddaughter via [place].  On chart review, it appears that this patient is taking Ambien for sleep and has not received a Xanax prescription since 2020.  Patient husband states that the patient will have mental breaks like the one today around the time of her grandmother's death, 2022-09-03.  According to the husband, the patient has not taken any of her medications in the last 2 days.  The patient's husband had all of the patient's medications with her and none of the medications he provided were antianxiety medications or psychiatric medications.  On examination, the patient is alert and oriented x4.  The patient follows commands appropriately.  Patient denies any chest pain, shortness of breath, nausea, vomiting, diarrhea, recent fevers, dysuria, flank pain.   Altered Mental Status Associated symptoms: hallucinations   Associated symptoms: no fever, no nausea and no vomiting        Home Medications Prior to Admission medications    Medication Sig Start Date End Date Taking? Authorizing Provider  bisacodyl (BISACODYL) 5 MG EC tablet Take 5 mg by mouth daily as needed for moderate constipation.   Yes [provider]  DULoxetine (CYMBALTA) 60 MG capsule Take 60 mg by mouth daily.    Yes [provider]  ezetimibe (ZETIA) 10 MG tablet Take 10 mg by mouth daily. 05/16/20  Yes [provider]  gabapentin (NEURONTIN) 600 MG tablet Take 600 mg by mouth 3 (three) times daily. 06/18/21  Yes [provider]  glipiZIDE (GLUCOTROL XL) 2.5 MG 24 hr tablet Take 2.5 mg by mouth daily with breakfast.   Yes [provider]  hydrochlorothiazide (HYDRODIURIL) 12.5 MG tablet Take 12.5 mg by mouth daily.   Yes [provider]  levocetirizine (XYZAL) 5 MG tablet Take 5 mg by mouth daily.   Yes [provider]  LORazepam (ATIVAN) 1 MG tablet Take 1 tablet (1 mg total) by mouth every 8 (eight) hours as needed for anxiety. 07/22/21  Yes Azucena Cecil, PA-C  metoprolol succinate (TOPROL-XL) 50 MG 24 hr tablet Take 50 mg by mouth every morning. Take with or immediately following a meal.   Yes [provider]  propranolol (INDERAL) 10 MG tablet Take 10 mg by mouth daily. 07/19/21  Yes [provider]  rivaroxaban (XARELTO) 10 MG TABS tablet Take 10 mg by mouth daily.   Yes [provider]  zolpidem (AMBIEN) 5 MG tablet Take 10 mg by mouth at bedtime as needed for sleep.   Yes [provider]  Allergies    Hydrocodone, Tramadol, Oxycodone, Codeine, and Darvocet [propoxyphene n-acetaminophen]    Review of Systems   Review of Systems  Constitutional:  Negative for chills and fever.  Respiratory:  Negative for shortness of breath.   Cardiovascular:  Negative for chest pain.  Gastrointestinal:  Negative for diarrhea, nausea and vomiting.  Genitourinary:  Negative for dysuria and flank pain.  Psychiatric/Behavioral:  Positive for hallucinations.  The patient is nervous/anxious.   All other systems reviewed and are negative.   Physical Exam Updated Vital Signs BP (!) 142/83   Pulse 99   Temp (!) 97.4 F (36.3 C)   Resp 16   Ht '5\' 5"'$  (1.651 m)   Wt 89.1 kg   SpO2 99%   BMI 32.69 kg/m  Physical Exam Vitals and nursing note reviewed.  Constitutional:      General: She is not in acute distress.    Appearance: Normal appearance. She is not ill-appearing, toxic-appearing or diaphoretic.  HENT:     Head: Normocephalic and atraumatic.     Nose: Nose normal. No congestion.     Mouth/Throat:     Mouth: Mucous membranes are moist.     Pharynx: Oropharynx is clear.  Eyes:     Extraocular Movements: Extraocular movements intact.     Conjunctiva/sclera: Conjunctivae normal.     Pupils: Pupils are equal, round, and reactive to light.  Cardiovascular:     Rate and Rhythm: Normal rate and regular rhythm.  Pulmonary:     Effort: Pulmonary effort is normal.     Breath sounds: Normal breath sounds. No wheezing.  Abdominal:     General: Abdomen is flat. Bowel sounds are normal.     Palpations: Abdomen is soft.     Tenderness: There is no abdominal tenderness. There is no right CVA tenderness, left CVA tenderness or guarding.  Musculoskeletal:     Cervical back: Normal range of motion and neck supple. No tenderness.  Skin:    General: Skin is warm and dry.     Capillary Refill: Capillary refill takes less than 2 seconds.  Neurological:     Mental Status: She is alert and oriented to person, place, and time.     GCS: GCS eye subscore is 4. GCS verbal subscore is 5. GCS motor subscore is 6.     Cranial Nerves: Cranial nerves 2-12 are intact. No cranial nerve deficit.     Sensory: Sensation is intact. No sensory deficit.     Motor: Motor function is intact. No weakness.     Coordination: Coordination is intact. Heel to [facility] Test normal.     Comments: Patient follows commands appropriately.  Patient alert and orient x4.  Patient  compliant, reasonable and cooperative.  No focal neurodeficits noted on examination.     ED Results / Procedures / Treatments   Labs (all labs ordered are listed, but only abnormal results are displayed) Labs Reviewed  CBG MONITORING, ED - Abnormal; Notable for the following components:      Result Value   Glucose-Capillary 113 (*)    All other components within normal limits    EKG None  Radiology No results found.  Procedures Procedures   Medications Ordered in ED Medications  LORazepam (ATIVAN) injection 2 mg (2 mg Intravenous Given 07/22/21 1341)    ED Course/ Medical Decision Making/ A&P  Medical Decision Making Risk Prescription drug management.   66 year old female presents to ED for evaluation.  Please see HPI for further details.  On examination, the patient is calm and cooperative.  The patient initially received 2 mg IM Ativan upon arrival to the ED for agitation.  Patient is alert and oriented x3 at this point.  The patient lung sounds are clear bilaterally she is not hypoxic on room air.  Patient afebrile and nontachycardic.  The patient abdomen is soft and compressible in all 4 quadrants.  The patient neurological examination shows no focal neurodeficits.  The patient has no unilateral numbness, weakness.  The patient is able to follow commands appropriately.  Patient was observed for a total time period of 2-1/2 hours in the ED.  During this time, the patient did not have any return of erratic behavior or hallucinations.  The patient denies SI/HI.  The patient denies any current hallucinations.  The patient will be sent home with short course of Ativan and advised to follow-up with PCP or psychiatrist for further treatment.  On chart review it appears that the patient has not received a Xanax prescription since 2020. The patient has strong support at home, she currently lives with her husband.  Patient has been advised that if she has  return of symptoms to report back to the ED for further management.  The patient husband voiced understanding of these instructions.  The patient and her husband are both amenable to this plan.  Return precautions given, patient and patient husband voiced understanding with these.  The patient and her husband had all of her questions answered to their satisfaction.  The patient is stable this time for discharge home.  Final Clinical Impression(s) / ED Diagnoses Final diagnoses:  Transient alteration of awareness    Rx / DC Orders ED Discharge Orders          Ordered    LORazepam (ATIVAN) 1 MG tablet  Every 8 hours PRN        07/22/21 1549              Azucena Cecil, PA-C 07/22/21 1551    Fredia Sorrow, MD 07/28/21 2337

## 2021-11-08 ENCOUNTER — Emergency Department (HOSPITAL_COMMUNITY)
Admission: EM | Admit: 2021-11-08 | Discharge: 2021-11-08 | Disposition: A | Discharge status: Home / Self Care | Payer: Medicare Other | Attending: Emergency Medicine | Admitting: Emergency Medicine

## 2021-11-08 ENCOUNTER — Emergency Department (HOSPITAL_COMMUNITY): Payer: Medicare Other

## 2021-11-08 ENCOUNTER — Encounter (HOSPITAL_COMMUNITY): Payer: Self-pay

## 2021-11-08 ENCOUNTER — Other Ambulatory Visit: Payer: Self-pay

## 2021-11-08 DIAGNOSIS — Z853 Personal history of malignant neoplasm of breast: Secondary | ICD-10-CM | POA: Insufficient documentation

## 2021-11-08 DIAGNOSIS — Z7901 Long term (current) use of anticoagulants: Secondary | ICD-10-CM | POA: Insufficient documentation

## 2021-11-08 DIAGNOSIS — R609 Edema, unspecified: Secondary | ICD-10-CM

## 2021-11-08 DIAGNOSIS — Z79899 Other long term (current) drug therapy: Secondary | ICD-10-CM | POA: Insufficient documentation

## 2021-11-08 DIAGNOSIS — R600 Localized edema: Secondary | ICD-10-CM | POA: Diagnosis not present

## 2021-11-08 DIAGNOSIS — M549 Dorsalgia, unspecified: Secondary | ICD-10-CM | POA: Diagnosis not present

## 2021-11-08 DIAGNOSIS — I10 Essential (primary) hypertension: Secondary | ICD-10-CM | POA: Insufficient documentation

## 2021-11-08 DIAGNOSIS — M79672 Pain in left foot: Secondary | ICD-10-CM | POA: Insufficient documentation

## 2021-11-08 LAB — COMPREHENSIVE METABOLIC PANEL
ALT: 8 U/L (ref 0–44)
AST: 16 U/L (ref 15–41)
Albumin: 3.8 g/dL (ref 3.5–5.0)
Alkaline Phosphatase: 79 U/L (ref 38–126)
Anion gap: 7 (ref 5–15)
BUN: 13 mg/dL (ref 8–23)
CO2: 28 mmol/L (ref 22–32)
Calcium: 9.3 mg/dL (ref 8.9–10.3)
Chloride: 105 mmol/L (ref 98–111)
Creatinine, Ser: 0.91 mg/dL (ref 0.44–1.00)
GFR, Estimated: >60 mL/min (ref >60)
Glucose, Bld: 91 mg/dL (ref 70–99)
Potassium: 3.5 mmol/L (ref 3.5–5.1)
Sodium: 140 mmol/L (ref 135–145)
Total Bilirubin: 0.3 mg/dL (ref 0.3–1.2)
Total Protein: 7.2 g/dL (ref 6.5–8.1)

## 2021-11-08 LAB — CBC WITH DIFFERENTIAL/PLATELET
Abs Immature Granulocytes: 0.01 10*3/uL (ref 0.00–0.07)
Basophils Absolute: 0 10*3/uL (ref 0.0–0.1)
Basophils Relative: 1 %
Eosinophils Absolute: 0.3 10*3/uL (ref 0.0–0.5)
Eosinophils Relative: 6 %
HCT: 36.3 % (ref 36.0–46.0)
Hemoglobin: 11.5 g/dL — ABNORMAL LOW (ref 12.0–15.0)
Immature Granulocytes: 0 %
Lymphocytes Relative: 39 %
Lymphs Abs: 2.1 10*3/uL (ref 0.7–4.0)
MCH: 28.7 pg (ref 26.0–34.0)
MCHC: 31.7 g/dL (ref 30.0–36.0)
MCV: 90.5 fL (ref 80.0–100.0)
Monocytes Absolute: 0.4 10*3/uL (ref 0.1–1.0)
Monocytes Relative: 7 %
Neutro Abs: 2.5 10*3/uL (ref 1.7–7.7)
Neutrophils Relative %: 47 %
Platelets: 323 10*3/uL (ref 150–400)
RBC: 4.01 MIL/uL (ref 3.87–5.11)
RDW: 13.8 % (ref 11.5–15.5)
WBC: 5.3 10*3/uL (ref 4.0–10.5)
nRBC: 0 % (ref 0.0–0.2)

## 2021-11-08 LAB — BRAIN NATRIURETIC PEPTIDE: B Natriuretic Peptide: 16 pg/mL (ref 0.0–100.0)

## 2021-11-08 MED ORDER — TIZANIDINE HCL 2 MG PO TABS: 2.0000 mg | ORAL_TABLET | Freq: Four times a day (QID) | ORAL | 0 refills | Status: DC | PRN

## 2021-11-08 MED ORDER — KETOROLAC TROMETHAMINE 15 MG/ML IJ SOLN
15.0000 mg | Freq: Once | INTRAMUSCULAR | Status: AC
  Administered 2021-11-08: 15 mg via INTRAMUSCULAR
  Filled 2021-11-08: qty 1

## 2021-11-08 NOTE — ED Provider Notes (Signed)
[facility] EMERGENCY DEPARTMENT Provider Note   CSN: [id number] Arrival date & time: 11/08/21  1551     History  Chief Complaint  Patient presents with   Leg Pain    Amanda Peterson is a 66 y.o. female.   Leg Pain Patient presents with somewhat acute on chronic back pain.  States she has had back pain for years.  Also has some swelling in the legs.  States the swelling is gotten worse and now more pain on the left foot.  States she cannot walk because of it.  Had previous back surgery.  Has had previous vessel surgery in the right leg.  Unsure exactly what is done but stay as they rerouted her veins.  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  Has previously been in a pain clinic.  Now states more swelling the legs and states she cannot get to see her cardiologist.  No chest pain or trouble breathing.    Past Medical History:  Diagnosis Date   Anxiety    Breast cancer (HCC)    Chronic back pain    Chronic neck pain    Depression    High cholesterol    History of suicidal ideation    Hypertension    Lumbar radiculopathy    Past Surgical History:  Procedure Laterality Date   BACK SURGERY     BREAST LUMPECTOMY     CERVICAL DISC SURGERY     SHOULDER SURGERY     THYROID SURGERY Right   \ Home Medications Prior to Admission medications   Medication Sig Start Date End Date Taking? Authorizing Provider  tiZANidine (ZANAFLEX) 2 MG tablet Take 1 tablet (2 mg total) by mouth every 6 (six) hours as needed for muscle spasms. 11/08/21  Yes Davonna Belling, MD  bisacodyl (BISACODYL) 5 MG EC tablet Take 5 mg by mouth daily as needed for moderate constipation.    [provider]  DULoxetine (CYMBALTA) 60 MG capsule Take 60 mg by mouth daily.     [provider]  ezetimibe (ZETIA) 10 MG tablet Take 10 mg by mouth daily. 05/16/20   [provider]  gabapentin (NEURONTIN) 600 MG tablet Take 600 mg by mouth 3 (three) times daily. 06/18/21   [provider]  glipiZIDE (GLUCOTROL XL) 2.5 MG 24 hr tablet Take 2.5 mg by mouth daily with breakfast.    [provider]  hydrochlorothiazide (HYDRODIURIL) 12.5 MG tablet Take 12.5 mg by mouth daily.    [provider]  levocetirizine (XYZAL) 5 MG tablet Take 5 mg by mouth daily.    [provider]  LORazepam (ATIVAN) 1 MG tablet Take 1 tablet (1 mg total) by mouth every 8 (eight) hours as needed for anxiety. 07/22/21   Azucena Cecil, PA-C  metoprolol succinate (TOPROL-XL) 50 MG 24 hr tablet Take 50 mg by mouth every morning. Take with or immediately following a meal.    [provider]  propranolol (INDERAL) 10 MG tablet Take 10 mg by mouth daily. 07/19/21   [provider]  rivaroxaban (XARELTO) 10 MG TABS tablet Take 10 mg by mouth daily.    [provider]  zolpidem (AMBIEN) 5 MG tablet Take 10 mg by mouth at bedtime as needed for sleep.    [provider]      Allergies    Hydrocodone, Tramadol, Oxycodone, Codeine, and Darvocet [propoxyphene n-acetaminophen]    Review of Systems   Review of Systems  Physical Exam Updated Vital  Signs BP (!) 140/65   Pulse (!) 57   Temp 98.1 F (36.7 C) (Oral)   Resp 17   Ht 5' 5.5" (1.664 m)   Wt 90.7 kg   SpO2 100%   BMI 32.78 kg/m  Physical Exam Vitals and nursing note reviewed.  Cardiovascular:     Rate and Rhythm: Regular rhythm.  Pulmonary:     Breath sounds: No wheezing.  Abdominal:     Tenderness: There is no abdominal tenderness.  Musculoskeletal:        General: Tenderness present.     Cervical back: Neck supple.     Comments: Pitting edema bilateral lower extremities.  Tenderness on left midfoot.  Strong dorsalis pedis pulse on left side.  Neurological:     Mental Status: She is alert and oriented to person, place, and time.     ED Results / Procedures / Treatments   Labs (all labs ordered are listed, but only abnormal results are displayed) Labs Reviewed  CBC  WITH DIFFERENTIAL/PLATELET - Abnormal; Notable for the following components:      Result Value   Hemoglobin 11.5 (*)    All other components within normal limits  COMPREHENSIVE METABOLIC PANEL  BRAIN NATRIURETIC PEPTIDE    EKG None  Radiology US Venous Img Lower Unilateral Left  Result Date: 11/08/2021 CLINICAL DATA:  Swelling, prior DVT. EXAM: Left LOWER EXTREMITY VENOUS DOPPLER ULTRASOUND TECHNIQUE: Gray-scale sonography with compression, as well as color and duplex ultrasound, were performed to evaluate the deep venous system(s) from the level of the common femoral vein through the popliteal and proximal calf veins. COMPARISON:  None Available. FINDINGS: VENOUS Normal compressibility of the common femoral, superficial femoral, and popliteal veins, as well as the visualized calf veins. Visualized portions of profunda femoral vein and great saphenous vein unremarkable. No filling defects to suggest DVT on grayscale or color Doppler imaging. Doppler waveforms show normal direction of venous flow, normal respiratory plasticity and response to augmentation. Limited views of the contralateral common femoral vein are unremarkable. OTHER None. Limitations: none IMPRESSION: Negative. Electronically Signed   By: Keane Police D.O.   On: 11/08/2021 17:25   DG Foot Complete Left  Result Date: 11/08/2021 CLINICAL DATA:  Left foot burning and swelling.  Ongoing for months. EXAM: LEFT FOOT - COMPLETE 3+ VIEW COMPARISON:  None available FINDINGS: Mild hallux valgus. Moderate second and third tarsometatarsal joint space narrowing, subchondral sclerosis, and subchondral degenerative cystic change. Mild fourth tarsometatarsal joint space narrowing and subchondral sclerosis. Mild dorsal tarsometatarsal degenerative osteophytosis. Small plantar calcaneal heel spur. No acute fracture or dislocation. IMPRESSION: 1. 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 2. Mild hallux valgus.  Electronically Signed   By: Yvonne Kendall M.D.   On: 11/08/2021 17:19    Procedures Procedures    Medications Ordered in ED Medications  ketorolac (TORADOL) 15 MG/ML injection 15 mg (15 mg Intramuscular Given 11/08/21 2058)    ED Course/ Medical Decision Making/ A&P                           Medical Decision Making Amount and/or Complexity of Data Reviewed Labs: ordered. Radiology: ordered.  Risk Prescription drug management.   Patient with acute on chronic pain on lower extremities with edema.  Reportedly DVT history.  Worse today.  Has been seen by pain meds in the past.  No chest pain or trouble breathing.  Will get x-ray of the foot since worsening  pain in the foot and will get ultrasound.  Also basic blood work to evaluate renal function. Renal function reassuring.  Ultrasound shows no DVT.  X-ray does show some arthritis potential could be caused.  We will to resume immobilization and symptomatic pain relief.  Have follow-up with PCP.        Final Clinical Impression(s) / ED Diagnoses Final diagnoses:  Left foot pain  Peripheral edema    Rx / DC Orders ED Discharge Orders          Ordered    tiZANidine (ZANAFLEX) 2 MG tablet  Every 6 hours PRN        11/08/21 2118              Davonna Belling, MD 11/09/21 1443

## 2021-11-08 NOTE — ED Triage Notes (Signed)
Left foot pain. Pt reports burning sensation. Swelling present. Pt can not put pressure on it. Hx of blood clots in legs. Ongoing for months. Pt states she can not walk and that is the difference today.

## 2022-01-07 IMAGING — DX DG CHEST 1V PORT
1 series · 1 of 1 positions shown · non-contrast
Comparison: Prior radiograph from 02/28/2013.

CLINICAL DATA: Initial evaluation for acute congestion.

EXAM:
PORTABLE CHEST 1 VIEW

[chest ap]
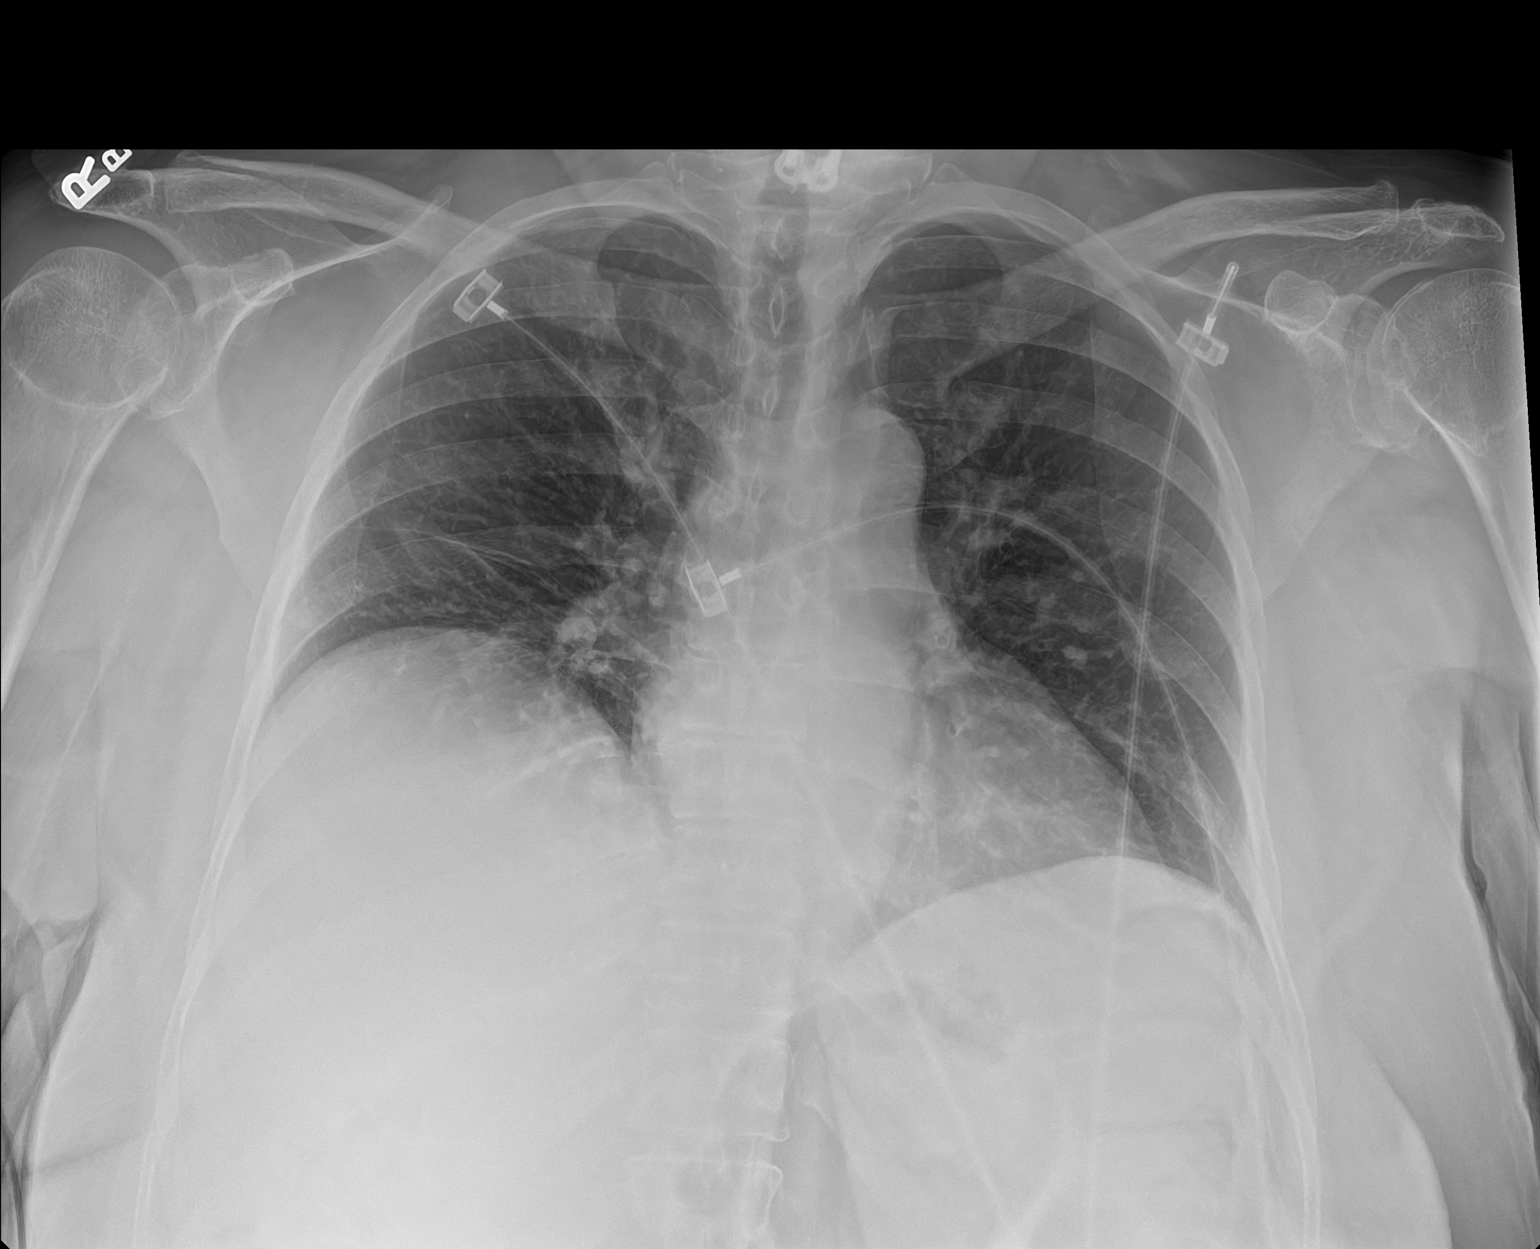

[1 of 1 positions shown; findings below may reference images not displayed]

FINDINGS: Transverse heart size within normal limits. Mediastinal silhouette
normal. Tortuosity the intrathoracic aorta.

Lungs hypoinflated with elevation of the right hemidiaphragm.
Secondary mild bibasilar bronchovascular crowding and/or
subsegmental atelectasis. No focal infiltrates or consolidative
airspace disease. No pulmonary edema or pleural effusion. No
pneumothorax.

No acute osseous finding.  Cervical ACDF noted.
IMPRESSION: 1. Shallow lung inflation with associated mild bibasilar
bronchovascular crowding and/or subsegmental atelectasis.
2. No other active cardiopulmonary disease.

## 2022-02-05 ENCOUNTER — Emergency Department (HOSPITAL_COMMUNITY)
Admission: EM | Admit: 2022-02-05 | Discharge: 2022-02-05 | Disposition: A | Discharge status: Home / Self Care | Payer: Medicare Other | Attending: Student | Admitting: Student

## 2022-02-05 ENCOUNTER — Other Ambulatory Visit: Payer: Self-pay

## 2022-02-05 ENCOUNTER — Encounter (HOSPITAL_COMMUNITY): Payer: Self-pay

## 2022-02-05 DIAGNOSIS — Z1152 Encounter for screening for COVID-19: Secondary | ICD-10-CM | POA: Diagnosis not present

## 2022-02-05 DIAGNOSIS — R0981 Nasal congestion: Secondary | ICD-10-CM | POA: Diagnosis present

## 2022-02-05 DIAGNOSIS — R590 Localized enlarged lymph nodes: Secondary | ICD-10-CM | POA: Insufficient documentation

## 2022-02-05 DIAGNOSIS — J069 Acute upper respiratory infection, unspecified: Secondary | ICD-10-CM | POA: Diagnosis not present

## 2022-02-05 LAB — BASIC METABOLIC PANEL
Anion gap: 8 (ref 5–15)
BUN: 9 mg/dL (ref 8–23)
CO2: 28 mmol/L (ref 22–32)
Calcium: 9.2 mg/dL (ref 8.9–10.3)
Chloride: 102 mmol/L (ref 98–111)
Creatinine, Ser: 1.02 mg/dL — ABNORMAL HIGH (ref 0.44–1.00)
GFR, Estimated: 60 mL/min — ABNORMAL LOW (ref >60)
Glucose, Bld: 103 mg/dL — ABNORMAL HIGH (ref 70–99)
Potassium: 4.4 mmol/L (ref 3.5–5.1)
Sodium: 138 mmol/L (ref 135–145)

## 2022-02-05 LAB — RESP PANEL BY RT-PCR (RSV, FLU A&B, COVID)  RVPGX2
Influenza A by PCR: NEGATIVE
Influenza B by PCR: NEGATIVE
Resp Syncytial Virus by PCR: NEGATIVE
SARS Coronavirus 2 by RT PCR: NEGATIVE

## 2022-02-05 NOTE — Discharge Instructions (Signed)
Seen today for nasal congestion, tenderness of a lymph node in her neck and sore throat.  Negative test for influenza RSV and COVID-19.  We also checked a basic metabolic panel because of your leg muscle cramping.  This was reassuring.  Make sure you drink plenty of fluids, rest you can use nasal saline to help with your congestion.  Your blood pressure was slightly elevated in the emergency department.  Follow-up with your primary care doctor for recheck as well.  Come back to the ER if you have any new or worsening symptoms such as shortness of breath, fevers, vomiting or other concerns

## 2022-02-05 NOTE — ED Provider Notes (Signed)
[facility] EMERGENCY DEPARTMENT Provider Note   CSN: [id number] Arrival date & time: 02/05/22  1038     History  Chief Complaint  Patient presents with   flu like symptoms    Amanda Peterson is a 67 y.o. female.  Complaining of sinus congestion and bodyaches since Saturday night, no known fever.  She also complaining of cramping intermittently in her bilateral lower extremities for the same time period.  She denies any vomiting but has had some nausea and decreased appetite.  She does report some soreness to an area on the right posterior neck but no swelling or injury.  No chest pain or shortness of breath, no other complaints. HPI     Home Medications Prior to Admission medications   Medication Sig Start Date End Date Taking? Authorizing Provider  bisacodyl (BISACODYL) 5 MG EC tablet Take 5 mg by mouth daily as needed for moderate constipation.    [provider]  DULoxetine (CYMBALTA) 60 MG capsule Take 60 mg by mouth daily.     [provider]  ezetimibe (ZETIA) 10 MG tablet Take 10 mg by mouth daily. 05/16/20   [provider]  gabapentin (NEURONTIN) 600 MG tablet Take 600 mg by mouth 3 (three) times daily. 06/18/21   [provider]  glipiZIDE (GLUCOTROL XL) 2.5 MG 24 hr tablet Take 2.5 mg by mouth daily with breakfast.    [provider]  hydrochlorothiazide (HYDRODIURIL) 12.5 MG tablet Take 12.5 mg by mouth daily.    [provider]  levocetirizine (XYZAL) 5 MG tablet Take 5 mg by mouth daily.    [provider]  LORazepam (ATIVAN) 1 MG tablet Take 1 tablet (1 mg total) by mouth every 8 (eight) hours as needed for anxiety. 07/22/21   Azucena Cecil, PA-C  metoprolol succinate (TOPROL-XL) 50 MG 24 hr tablet Take 50 mg by mouth every morning. Take with or immediately following a meal.    [provider]  propranolol (INDERAL) 10 MG tablet Take 10 mg by mouth daily. 07/19/21   [provider]   rivaroxaban (XARELTO) 10 MG TABS tablet Take 10 mg by mouth daily.    [provider]  tiZANidine (ZANAFLEX) 2 MG tablet Take 1 tablet (2 mg total) by mouth every 6 (six) hours as needed for muscle spasms. 11/08/21   Davonna Belling, MD  zolpidem (AMBIEN) 5 MG tablet Take 10 mg by mouth at bedtime as needed for sleep.    [provider]      Allergies    Hydrocodone, Tramadol, Oxycodone, Codeine, and Darvocet [propoxyphene n-acetaminophen]    Review of Systems   Review of Systems  Physical Exam Updated Vital Signs BP (!) 123/108 (BP Location: Right Arm)   Pulse 88   Temp 98.7 F (37.1 C) (Oral)   Resp 14   Ht '5\' 5"'$  (1.651 m)   Wt 90.7 kg   SpO2 96%   BMI 33.28 kg/m  Physical Exam Vitals and nursing note reviewed.  Constitutional:      General: She is not in acute distress.    Appearance: She is well-developed.  HENT:     Head: Normocephalic and atraumatic.  Eyes:     Conjunctiva/sclera: Conjunctivae normal.  Neck:     Comments: The enlarged posterior Cardiovascular:     Rate and Rhythm: Normal rate and regular rhythm.     Heart sounds: No murmur heard. Pulmonary:     Effort: Pulmonary effort is normal. No respiratory distress.  Breath sounds: Normal breath sounds.  Abdominal:     Palpations: Abdomen is soft.     Tenderness: There is no abdominal tenderness.  Musculoskeletal:        General: No swelling.     Cervical back: Neck supple.  Lymphadenopathy:     Cervical: Cervical adenopathy present.     Right cervical: Superficial cervical adenopathy present.  Skin:    General: Skin is warm and dry.     Capillary Refill: Capillary refill takes less than 2 seconds.  Neurological:     Mental Status: She is alert.  Psychiatric:        Mood and Affect: Mood normal.     ED Results / Procedures / Treatments   Labs (all labs ordered are listed, but only abnormal results are displayed) Labs Reviewed  BASIC METABOLIC PANEL - Abnormal; Notable  for the following components:      Result Value   Glucose, Bld 103 (*)    Creatinine, Ser 1.02 (*)    GFR, Estimated 60 (*)    All other components within normal limits  RESP PANEL BY RT-PCR (RSV, FLU A&B, COVID)  [id number]    EKG None  Radiology No results found.  Procedures Procedures    Medications Ordered in ED Medications - No data to display  ED Course/ Medical Decision Making/ A&P                           Medical Decision Making Ddx: Viral 23, influenza, sinusitis, URI, electrolyte abnormality, other ED course: Patient is here for sinus congestion, sore throat and right posterior lymph node tenderness.  She is well-appearing on exam.  She has no shortness of breath or fevers.  She is negative for COVID flu and RSV.  She was complaining of some bilateral leg cramping that has been intermittent.  She has no calf tenderness or swelling on exam.  No history of VTE, does have some intermittent muscle cramping.  BMP was ordered and is normal.  No hypokalemia.  Patient has no muscle spasms here in the ED.  Advised on hydration and supportive care at home for her symptoms, discussed is likely viral URI.  She is advised on follow-up with her primary care doctor and strict return precautions.  Amount and/or Complexity of Data Reviewed Labs: ordered.           Final Clinical Impression(s) / ED Diagnoses Final diagnoses:  Viral upper respiratory tract infection    Rx / DC Orders ED Discharge Orders     None         Gwenevere Abbot, PA-C 02/05/22 1842    Teressa Lower, MD 02/05/22 2016

## 2022-02-05 NOTE — ED Triage Notes (Signed)
Pt reports headache, body aches, sore throat,  cough since Saturday night.

## 2022-04-02 ENCOUNTER — Encounter (HOSPITAL_COMMUNITY): Payer: Self-pay

## 2022-04-02 ENCOUNTER — Emergency Department (HOSPITAL_COMMUNITY): Payer: Medicare Other

## 2022-04-02 ENCOUNTER — Other Ambulatory Visit: Payer: Self-pay

## 2022-04-02 ENCOUNTER — Emergency Department (HOSPITAL_COMMUNITY)
Admission: EM | Admit: 2022-04-02 | Discharge: 2022-04-02 | Disposition: A | Discharge status: Home / Self Care | Payer: Medicare Other | Attending: Emergency Medicine | Admitting: Emergency Medicine

## 2022-04-02 DIAGNOSIS — M79605 Pain in left leg: Secondary | ICD-10-CM | POA: Diagnosis present

## 2022-04-02 DIAGNOSIS — Z853 Personal history of malignant neoplasm of breast: Secondary | ICD-10-CM | POA: Diagnosis not present

## 2022-04-02 DIAGNOSIS — Z79899 Other long term (current) drug therapy: Secondary | ICD-10-CM | POA: Insufficient documentation

## 2022-04-02 DIAGNOSIS — Z7901 Long term (current) use of anticoagulants: Secondary | ICD-10-CM | POA: Diagnosis not present

## 2022-04-02 DIAGNOSIS — I10 Essential (primary) hypertension: Secondary | ICD-10-CM | POA: Diagnosis not present

## 2022-04-02 DIAGNOSIS — I82432 Acute embolism and thrombosis of left popliteal vein: Secondary | ICD-10-CM | POA: Diagnosis not present

## 2022-04-02 LAB — BASIC METABOLIC PANEL
Anion gap: 5 (ref 5–15)
BUN: 13 mg/dL (ref 8–23)
CO2: 26 mmol/L (ref 22–32)
Calcium: 8.5 mg/dL — ABNORMAL LOW (ref 8.9–10.3)
Chloride: 107 mmol/L (ref 98–111)
Creatinine, Ser: 0.89 mg/dL (ref 0.44–1.00)
GFR, Estimated: >60 mL/min (ref >60)
Glucose, Bld: 85 mg/dL (ref 70–99)
Potassium: 3.8 mmol/L (ref 3.5–5.1)
Sodium: 138 mmol/L (ref 135–145)

## 2022-04-02 LAB — CBC
HCT: 36.3 % (ref 36.0–46.0)
Hemoglobin: 11.5 g/dL — ABNORMAL LOW (ref 12.0–15.0)
MCH: 28.5 pg (ref 26.0–34.0)
MCHC: 31.7 g/dL (ref 30.0–36.0)
MCV: 89.9 fL (ref 80.0–100.0)
Platelets: 279 10*3/uL (ref 150–400)
RBC: 4.04 MIL/uL (ref 3.87–5.11)
RDW: 13.2 % (ref 11.5–15.5)
WBC: 4.7 10*3/uL (ref 4.0–10.5)
nRBC: 0 % (ref 0.0–0.2)

## 2022-04-02 MED ORDER — RIVAROXABAN 15 MG PO TABS
15.0000 mg | ORAL_TABLET | Freq: Once | ORAL | Status: AC
  Administered 2022-04-02: 15 mg via ORAL
  Filled 2022-04-02: qty 1

## 2022-04-02 MED ORDER — RIVAROXABAN (XARELTO) VTE STARTER PACK (15 & 20 MG): ORAL_TABLET | ORAL | 0 refills | Status: DC

## 2022-04-02 MED ORDER — FENTANYL CITRATE PF 50 MCG/ML IJ SOSY
50.0000 ug | PREFILLED_SYRINGE | Freq: Once | INTRAMUSCULAR | Status: AC
  Administered 2022-04-02: 50 ug via INTRAMUSCULAR
  Filled 2022-04-02: qty 1

## 2022-04-02 NOTE — ED Provider Notes (Signed)
[facility] Provider Note   CSN: [id number] Arrival date & time: 04/02/22  1157     History  Chief Complaint  Patient presents with   Leg Pain    Amanda Peterson is a 67 y.o. female with medical history of anxiety, breast cancer, chronic back pain, chronic neck pain, high cholesterol, hypertension, lumbar radiculopathy.  The patient presents to the ED for evaluation of bilateral leg pain.  Patient reports that for about the last 5 months she has had bilateral leg pain and cramping.  The patient reports that sometimes at night her left leg will become very restless, cramp which will be very uncomfortable.  Patient reports that she has been seen by numerous specialists.  Patient reports that she was at 1 point referred to a pain clinic however did not ever go and follow-up with the pain clinic because she "wants to live, does not want to be doped up on narcotics".  Patient states that over the last 5 months that she has had progressively worsening lower extremity pain.  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  Patient denies any fevers, nausea or vomiting.  Patient also reports that she has been placed on gabapentin in the past for low back pain.  Patient states that she is not a diabetic however does have prediabetes but goes on to state that she takes medication for her diabetes.   Leg Pain      Home Medications Prior to Admission medications   Medication Sig Start Date End Date Taking? Authorizing Provider  bisacodyl (BISACODYL) 5 MG EC tablet Take 5 mg by mouth daily as needed for moderate constipation.   Yes [provider]  DULoxetine (CYMBALTA) 60 MG capsule Take 60 mg by mouth daily.    Yes [provider]  ezetimibe (ZETIA) 10 MG tablet Take 10 mg by mouth daily.  05/16/20  Yes [provider]  furosemide (LASIX) 20 MG tablet Take 1 tablet by mouth daily.   Yes [provider]  gabapentin (NEURONTIN) 600 MG tablet Take 600 mg by mouth 3 (three) times daily. 06/18/21  Yes [provider]  glipiZIDE (GLUCOTROL XL) 2.5 MG 24 hr tablet Take 2.5 mg by mouth daily with breakfast.   Yes [provider]  hydrochlorothiazide (HYDRODIURIL) 12.5 MG tablet Take 12.5 mg by mouth daily.   Yes [provider]  levocetirizine (XYZAL) 5 MG tablet Take 5 mg by mouth daily.   Yes [provider]  lubiprostone (AMITIZA) 24 MCG capsule Take 24 mcg by mouth 2 (two) times daily. 01/23/22  Yes [provider]  metoprolol succinate (TOPROL-XL) 50 MG 24 hr tablet Take 50 mg by mouth every morning. Take with or immediately following a meal.   Yes [provider]  RIVAROXABAN (XARELTO) VTE STARTER PACK (15 & 20 MG) Take 15 mg by mouth in the morning and at bedtime for 21 days, THEN 20 mg daily for 8 days. Follow package directions: Take one '15mg'$  tablet by mouth twice a day. On day 22, switch to one '20mg'$  tablet once a day. Take with food.. 04/02/22 05/01/22 Yes Azucena Cecil, PA-C  sucralfate (CARAFATE) 1 g tablet Take 1 g by mouth 4 (four) times daily. 03/19/22  Yes [provider]  zolpidem (AMBIEN) 5 MG tablet Take  10 mg by mouth at bedtime as needed for sleep.   Yes [provider]  tiZANidine (ZANAFLEX) 2 MG tablet Take 1 tablet (2 mg total) by mouth every 6 (six) hours as needed for muscle spasms. 11/08/21   Davonna Belling, MD  triamcinolone (NASACORT) 55 MCG/ACT AERO nasal inhaler as needed.    [provider]      Allergies    Hydrocodone, Tramadol, Oxycodone, Codeine, and Darvocet [propoxyphene n-acetaminophen]    Review of Systems   Review of Systems  Musculoskeletal:  Positive for myalgias.  All other systems reviewed and are negative.   Physical Exam Updated Vital  Signs BP 137/65 (BP Location: Left Arm)   Pulse (!) 58   Temp 98 F (36.7 C) (Oral)   Resp 14   Ht '5\' 6"'$  (1.676 m)   Wt 91.6 kg   SpO2 100%   BMI 32.60 kg/m  Physical Exam Vitals and nursing note reviewed.  Constitutional:      General: She is not in acute distress.    Appearance: Normal appearance. She is not ill-appearing, toxic-appearing or diaphoretic.  HENT:     Head: Normocephalic and atraumatic.     Nose: Nose normal. No congestion.     Mouth/Throat:     Mouth: Mucous membranes are moist.     Pharynx: Oropharynx is clear.  Eyes:     Extraocular Movements: Extraocular movements intact.     Conjunctiva/sclera: Conjunctivae normal.     Pupils: Pupils are equal, round, and reactive to light.  Cardiovascular:     Rate and Rhythm: Normal rate and regular rhythm.  Pulmonary:     Effort: Pulmonary effort is normal.     Breath sounds: Normal breath sounds. No wheezing.  Abdominal:     General: Abdomen is flat. Bowel sounds are normal.     Palpations: Abdomen is soft.     Tenderness: There is no abdominal tenderness.  Musculoskeletal:     Cervical back: Normal range of motion and neck supple. No tenderness.     Comments: No overlying skin change patient bilateral lower extremities.  Negative Homans' sign bilaterally.  2+ DP pulse on the patient bilateral lower extremities.  5 out of 5 strength bilateral lower extremities.  Skin:    General: Skin is warm and dry.     Capillary Refill: Capillary refill takes less than 2 seconds.  Neurological:     General: No focal deficit present.     Mental Status: She is alert and oriented to person, place, and time.     GCS: GCS eye subscore is 4. GCS verbal subscore is 5. GCS motor subscore is 6.     Cranial Nerves: Cranial nerves 2-12 are intact. No cranial nerve deficit.     Sensory: Sensation is intact. No sensory deficit.     Motor: Motor function is intact. No weakness.     Coordination: Coordination is intact. Heel to Shin Test  normal.     ED Results / Procedures / Treatments   Labs (all labs ordered are listed, but only abnormal results are displayed) Labs Reviewed  CBC - Abnormal; Notable for the following components:      Result Value   Hemoglobin 11.5 (*)    All other components within normal limits  BASIC METABOLIC PANEL - Abnormal; Notable for the following components:   Calcium 8.5 (*)    All other components within normal limits    EKG None  Radiology US Venous Img Lower Bilateral (DVT)  Result Date: 04/02/2022 CLINICAL DATA:  Bilateral foot pain, history of hypercoagulable state EXAM: BILATERAL LOWER EXTREMITY VENOUS DOPPLER ULTRASOUND TECHNIQUE: Gray-scale sonography with graded compression, as well as color Doppler and duplex ultrasound were performed to evaluate the lower extremity deep venous systems from the level of the common femoral vein and including the common femoral, femoral, profunda femoral, popliteal and calf veins including the posterior tibial, peroneal and gastrocnemius veins when visible. The superficial great saphenous vein was also interrogated. Spectral Doppler was utilized to evaluate flow at rest and with distal augmentation maneuvers in the common femoral, femoral and popliteal veins. COMPARISON:  11/08/2021 FINDINGS: RIGHT LOWER EXTREMITY Common Femoral Vein: No evidence of thrombus. Normal compressibility, respiratory phasicity and response to augmentation. Saphenofemoral Junction: No evidence of thrombus. Normal compressibility and flow on color Doppler imaging. Profunda Femoral Vein: No evidence of thrombus. Normal compressibility and flow on color Doppler imaging. Femoral Vein: No evidence of thrombus. Normal compressibility, respiratory phasicity and response to augmentation. Popliteal Vein: No evidence of thrombus. Normal compressibility, respiratory phasicity and response to augmentation. Calf Veins: No evidence of thrombus. Normal compressibility and flow on color Doppler  imaging. Superficial Great Saphenous Vein: No evidence of thrombus. Normal compressibility. Other Findings:  None. LEFT LOWER EXTREMITY Common Femoral Vein: No evidence of thrombus. Normal compressibility, respiratory phasicity and response to augmentation. Saphenofemoral Junction: No evidence of thrombus. Normal compressibility and flow on color Doppler imaging. Profunda Femoral Vein: No evidence of thrombus. Normal compressibility and flow on color Doppler imaging. Femoral Vein: No evidence of thrombus. Normal compressibility, respiratory phasicity and response to augmentation. Popliteal Vein: There is occlusive thrombus within the left popliteal vein. The vessel is noncompressible, and no color flow or Doppler waveforms are identified. Calf Veins: No evidence of thrombus. Normal compressibility and flow on color Doppler imaging. Superficial Great Saphenous Vein: No evidence of thrombus. Normal compressibility. Other Findings:  None. IMPRESSION: 1. Occlusive deep venous thrombosis of the left popliteal vein. The remaining left lower extremity venous structures are unremarkable. 2. No evidence of right lower extremity DVT. These results will be called to the ordering clinician or representative by the Radiologist Assistant, and communication documented in the PACS or [employer]. Electronically Signed   By: Randa Ngo M.D.   On: 04/02/2022 15:00    Procedures Procedures   Medications Ordered in ED Medications  fentaNYL (SUBLIMAZE) injection 50 mcg (50 mcg Intramuscular Given 04/02/22 1309)  Rivaroxaban (XARELTO) tablet 15 mg (15 mg Oral Given 04/02/22 1612)    ED Course/ Medical Decision Making/ A&P  Medical Decision Making  67 year old female presents to the ED for evaluation.  Please see HPI for further details.  On examination the patient is afebrile and nontachycardic.  Lung sounds are clear bilaterally, she is not hypoxic on room air.  Abdomen is soft and compressible throughout.   Neurological examination without focal neurodeficits.  Patient bilateral lower extremities have no overlying skin change, 2+ DP pulse, brisk capillary refill, negative Homans' sign.  Patient DVT bilateral study shows occlusive DVT of the left popliteal vein.  No evidence of DVT in the right lower extremity.  Patient provided initial dose of Xarelto here.  Patient reports that she is unable to afford Xarelto medication and this is the reason that she has not been compliant on this medication for the last 6 months.  TOC consult placed, pharmacist came down and provided patient coupon for Xarelto starter pack.  The patient will be referred to the DVT clinic for further management.  Return precautions to include chest pain or shortness of breath were provided to the patient and she voiced understanding.  The patient had all of her questions answered to her satisfaction.  The patient is stable for discharge.   Final Clinical Impression(s) / ED Diagnoses Final diagnoses:  Acute deep vein thrombosis (DVT) of popliteal vein of left lower extremity ([place])    Rx / DC Orders ED Discharge Orders          Ordered    RIVAROXABAN (XARELTO) VTE STARTER PACK (15 & 20 MG)  Multiple Frequencies        04/02/22 1647    AMB Referral to [facility]       Comments: Please call [phone] with any questions.   04/02/22 1651              Azucena Cecil, PA-C 04/02/22 1651    Carmin Muskrat, MD 04/03/22 [phone]

## 2022-04-02 NOTE — Discharge Instructions (Addendum)
Return to the ED with any new or worsening signs or symptoms such as chest pain or shortness of breath Please begin taking Xarelto starter pack beginning tomorrow.  You received your first dose of medication here tonight in the ED. Please follow-up with a vascular specialist.  Please utilize name and number on this form for follow-up appointment. Please read attached guide concerning VTE prevention

## 2022-04-02 NOTE — ED Triage Notes (Signed)
Pt c/o bilateral leg pain starting from hips down to toes. Describes it as a cramping/burning feeling, worse with ambulation.   Pt has hx of blood clots and is prescribed blood thinners but has not taken them in about 6 months due to cost.

## 2022-04-02 NOTE — ED Notes (Signed)
TOC consulted for medication assistance as pt is unable to afford DVT medications. TOC reached out to pharmacy who will bring down a coupon card for pt. TOC signing off.

## 2022-04-03 ENCOUNTER — Other Ambulatory Visit (HOSPITAL_COMMUNITY): Payer: Self-pay

## 2022-04-03 ENCOUNTER — Telehealth (HOSPITAL_COMMUNITY): Payer: Self-pay | Admitting: Student-PharmD

## 2022-04-03 NOTE — Telephone Encounter (Signed)
Patient was prescribed a Xarelto starter pack in the [facility] ED yesterday for new DVT. The pharmacy it was sent to does not have it in stock. Patient is scheduled for follow up in the DVT Clinic next week and reached out to DVT nurse navigator letting him know she hasn't been able to pick up her medication yet. Her [facility] won't have it in stock for at least a couple days. Since the patient lives in [place], I called multiple pharmacies in the [place] area. Only [facility] has the Xarelto starter pack in stock. I called the patient asking if she could pick up the medication from there. She is concerned since [facility] already ran her one time $0 savings card that she won't be able to afford the prescription at the new pharmacy. I offered to call [facility] to see if they can back out of it but unclear if that works the same for coupon cards. Also offered for the patient to come to the DVT Clinic to pick up samples to last her until her prescription can be filled at her [facility], which is her preference. She and her husband are on the way to the clinic now. Samples prepared for the patient. She will still keep her follow up in the DVT Clinic next week.   Medication Samples have been provided to the patient.  Drug name: Xarelto (rivaroxaban)       Strength: 15 mg        Qty: 14 tablets  LOT: [id number]  Exp. Date: 03/28/2023  Dosing instructions: Take one tablet (15 mg) twice daily with food for 21 days, followed by 20 mg daily with food. Samples will last her the 1st week of 15 mg BID dose.   The patient has been instructed regarding the correct time, dose, and frequency of taking this medication, including desired effects and most common side effects.   Amanda Peterson, PharmD, [place], CPP [facility] Clinical Pharmacist Practitioner Office: [phone] 04/03/2022 12:59 PM

## 2022-04-08 ENCOUNTER — Ambulatory Visit (HOSPITAL_COMMUNITY)
Admission: RE | Admit: 2022-04-08 | Discharge: 2022-04-08 | Disposition: A | Discharge status: Home / Self Care | Payer: Medicare Other | Source: Ambulatory Visit | Attending: Vascular Surgery | Admitting: Vascular Surgery

## 2022-04-08 ENCOUNTER — Encounter (HOSPITAL_COMMUNITY): Payer: Self-pay

## 2022-04-08 VITALS — BP 129/65 | HR 58

## 2022-04-08 DIAGNOSIS — I82432 Acute embolism and thrombosis of left popliteal vein: Secondary | ICD-10-CM

## 2022-04-08 MED ORDER — RIVAROXABAN 20 MG PO TABS: 20.0000 mg | ORAL_TABLET | Freq: Every day | ORAL | 5 refills | Status: DC

## 2022-04-08 NOTE — Patient Instructions (Signed)
-  Continue rivaroxaban (Xarelto) 15 mg twice daily with food for 21 days followed by 20 mg daily with food. -Your refills have been sent to your [facility] in [place]. You will likely need to call the pharmacy to ask them to fill this when you start to run low on your current supply.  -It is important to take your medication around the same time every day.  -Avoid NSAIDs like ibuprofen (Advil, Motrin) and naproxen (Aleve) as well as aspirin doses over 100 mg daily. -Tylenol (acetaminophen) is the preferred over the counter pain medication to lower the risk of bleeding. -Be sure to alert all of your health care providers that you are taking an anticoagulant prior to starting a new medication or having a procedure. -Monitor for signs and symptoms of bleeding (abnormal bruising, prolonged bleeding, nose bleeds, bleeding from gums, discolored urine, black tarry stools). If you have fallen and hit your head OR if your bleeding is severe or not stopping, seek emergency care.  -Go to the emergency room if emergent signs and symptoms of new clot occur (new or worse swelling and pain in an arm or leg, shortness of breath, chest pain, fast or irregular heartbeats, lightheadedness, dizziness, fainting, coughing up blood) or if you experience a significant color change (pale or blue) in the extremity that has the DVT.  -We recommend you wear compression stockings as long as you are having swelling or pain. Be sure to purchase the correct size and take them off at night.   Your next visit is on Wednesday, April 8th at [facility] [place], [place], [place] [address] Enter the hospital through Entrance [address] off [facility] and pull up to the [place] entrance to the free [place] parking.  Check in for your appointment at the [place].   If you have any questions or need to reschedule an appointment, please call [phone] [facility].  If you are  having an emergency, call 911 or present to the nearest emergency room.   What is a DVT?  -Deep vein thrombosis (DVT) is a condition in which a blood clot forms in a vein of the deep venous system which can occur in the lower leg, thigh, pelvis, arm, or neck. This condition is serious and can be life-threatening if the clot travels to the arteries of the lungs and causing a blockage (pulmonary embolism, PE). A DVT can also damage veins in the leg, which can lead to long-term venous disease, leg pain, swelling, discoloration, and ulcers or sores (post-thrombotic syndrome).  -Treatment may include taking an anticoagulant medication to prevent more clots from forming and the current clot from growing, wearing compression stockings, and/or surgical procedures to remove or dissolve the clot.

## 2022-04-08 NOTE — Progress Notes (Signed)
DVT Clinic Note  Name: Amanda Peterson     MRN: [id number]     DOB: September 01, 1955     Sex: female  PCP: Allayne Butcher, NP  Today's Visit: Visit Information: Initial Visit  Referred to DVT Clinic by: Genevive Bi, PA-C [facility] ED)  Referred to CPP by: Dr. Virl Cagey Reason for referral:  Chief Complaint  Patient presents with   DVT   HISTORY OF PRESENT ILLNESS:  Amanda Peterson is a 67 y.o. female with PMH HLD, HTN, PVD, DVT, PE, post-thrombotic syndrome, breast cancer (in remission), anxiety, and chronic pain, who presents accompanied by her husband after diagnosis of DVT for medication management. Patient describes extensive history of DVTs. First LLE DVT occurred around 8 years ago following a surgery on the R knee. In 2021, she was found to have a LLE DVT and PE following COVID infection. Treatment at that time was complicated by a GI bleed. IVC filter was placed and later retrieved. GI bleed resolved on its own with no known cause. She has taken anticoagulation since then with no recurrence of bleeding. She also had a superficial thrombus in the left leg about 2 years ago. She reports that her oncologist did lab work and did find a lab marker that made her more susceptible to clotting but she does not remember what it is. She sees her oncologist again this week and will ask. He restarted her on Xarelto when that was found, but the patient ultimately stopped taking it due to cost. Reports it cost her $450/month in the past.   Patient reports history of cramping and pain in the L leg for over a month and began to worsen recently. She was found to have DVT involving L popliteal vein on 04/02/22 in the [facility] ED and Xarelto was started with follow up planned in the DVT Clinic. The patient reached out because her pharmacy did not have the starter pack in stock and it was going to be a few days. I called around to other pharmacies and ultimately had the patient come in to the clinic to pick up samples  to last until her pharmacy was able to get it in. She has had no problems since starting Xarelto and is taking it correctly. 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 She wears compression stockings daily.   Positive Thrombotic Risk Factors: Previous VTE, Known thrombophilic condition, Older Age Bleeding Risk Factors: Anemia, Anticoagulant therapy, Age >65 years  Negative Thrombotic Risk Factors: Active cancer, Estrogen therapy, Testosterone therapy, Pregnancy, Recent cesarean section (within 3 months), Within 6 weeks postpartum, Erythropoiesis-stimulating agent, Recent surgery (within 3 months), Recent trauma (within 3 months), Recent admission to hospital with acute illness (within 3 months), Paralysis, paresis, or recent plaster cast immobilization of lower extremity, Central venous catheterization, Sedentary journey lasting >8 hours within 4 weeks, Bed rest >72 hours within 3 months, Recent COVID diagnosis (within 3 months), Smoking, Non-malignant, chronic inflammatory condition  Rx Insurance Coverage: Medicare Rx Affordability: Xarelto copay is $47/month Preferred Pharmacy: [facility] in [place], [place]  Past Medical History:  Diagnosis Date   Anxiety    Breast cancer ([place])    Chronic back pain    Chronic neck pain    Depression    High cholesterol    History of suicidal ideation    Hypertension    Lumbar radiculopathy     Past Surgical History:  Procedure Laterality Date   BACK SURGERY  BREAST LUMPECTOMY     CERVICAL DISC SURGERY     SHOULDER SURGERY     THYROID SURGERY Right     Social History   Socioeconomic History   Marital status: Married    Spouse name: Not on file   Number of children: Not on file   Years of education: Not on file   Highest education level: Not on file  Occupational History   Not on file  Tobacco Use   Smoking status: Never   Smokeless tobacco: Never  Vaping Use   Vaping Use: Never used   Substance and Sexual Activity   Alcohol use: No   Drug use: No   Sexual activity: Not on file  Other Topics Concern   Not on file  Social History Narrative   Not on file   Social Determinants of Health   Financial Resource Strain: Not on file  Food Insecurity: Not on file  Transportation Needs: Not on file  Physical Activity: Not on file  Stress: Not on file  Social Connections: Not on file  Intimate Partner Violence: Not on file    No family history on file.  Allergies as of 04/08/2022 - Review Complete 04/08/2022  Allergen Reaction Noted   Hydrocodone Anaphylaxis 07/22/2021   Tramadol Anaphylaxis 07/22/2021   Oxycodone Other (See Comments) 11/21/2012   Codeine Rash 11/21/2012   Darvocet [propoxyphene n-acetaminophen] Rash 11/21/2012    Current Outpatient Medications on File Prior to Encounter  Medication Sig Dispense Refill   acetaminophen (TYLENOL) 500 MG tablet Take 1,000 mg by mouth every 8 (eight) hours as needed for mild pain or moderate pain.     bisacodyl (BISACODYL) 5 MG EC tablet Take 5 mg by mouth daily as needed for moderate constipation.     DULoxetine (CYMBALTA) 60 MG capsule Take 60 mg by mouth daily.      ezetimibe (ZETIA) 10 MG tablet Take 10 mg by mouth daily.     furosemide (LASIX) 20 MG tablet Take 1 tablet by mouth daily.     gabapentin (NEURONTIN) 600 MG tablet Take 600 mg by mouth 3 (three) times daily.     glipiZIDE (GLUCOTROL XL) 2.5 MG 24 hr tablet Take 2.5 mg by mouth daily with breakfast.     hydrochlorothiazide (HYDRODIURIL) 12.5 MG tablet Take 12.5 mg by mouth daily.     levocetirizine (XYZAL) 5 MG tablet Take 5 mg by mouth daily.     lubiprostone (AMITIZA) 24 MCG capsule Take 24 mcg by mouth 2 (two) times daily.     metoprolol succinate (TOPROL-XL) 50 MG 24 hr tablet Take 50 mg by mouth every morning. Take with or immediately following a meal.     RIVAROXABAN (XARELTO) VTE STARTER PACK (15 & 20 MG) Take 15 mg by mouth in the morning and at  bedtime for 21 days, THEN 20 mg daily for 8 days. Follow package directions: Take one '15mg'$  tablet by mouth twice a day. On day 22, switch to one '20mg'$  tablet once a day. Take with food.. 51 each 0   sucralfate (CARAFATE) 1 g tablet Take 1 g by mouth 4 (four) times daily.     tiZANidine (ZANAFLEX) 2 MG tablet Take 1 tablet (2 mg total) by mouth every 6 (six) hours as needed for muscle spasms. 8 tablet 0   triamcinolone (NASACORT) 55 MCG/ACT AERO nasal inhaler as needed.     zolpidem (AMBIEN) 5 MG tablet Take 10 mg by mouth at bedtime as needed for sleep.  No current facility-administered medications on file prior to encounter.   REVIEW OF SYSTEMS:  Review of Systems  Respiratory:  Negative for shortness of breath.   Cardiovascular:  Positive for leg swelling. Negative for chest pain and palpitations.  Gastrointestinal:  Negative for blood in stool and melena.  Genitourinary:  Negative for hematuria.  Musculoskeletal:  Positive for myalgias.  Neurological:  Positive for tingling. Negative for dizziness.   PHYSICAL EXAMINATION:  Vitals:   04/08/22 0955  BP: 129/65  Pulse: (!) 58  SpO2: 99%    Physical Exam Vitals reviewed.  Pulmonary:     Effort: Pulmonary effort is normal.  Musculoskeletal:     Right lower leg: Edema (1+) present.     Left lower leg: Edema (1+) present.  Psychiatric:        Mood and Affect: Mood normal.        Behavior: Behavior normal.        Thought Content: Thought content normal.   Villalta Score for Post-Thrombotic Syndrome: Pain: Severe Cramps: Moderate Heaviness: Severe Paresthesia: Severe Pruritus: Severe Pretibial Edema: Mild Skin Induration: Absent Hyperpigmentation: Absent Redness: Absent Venous Ectasia: Absent Pain on calf compression: Mild Villalta Preliminary Score: 16 Is venous ulcer present?: No If venous ulcer is present and score is <15, then 15 points total are assigned: Absent Villalta Total Score: 16  LABS:  CBC      Component Value Date/Time   WBC 4.7 04/02/2022 1537   RBC 4.04 04/02/2022 1537   HGB 11.5 (L) 04/02/2022 1537   HCT 36.3 04/02/2022 1537   PLT 279 04/02/2022 1537   MCV 89.9 04/02/2022 1537   MCH 28.5 04/02/2022 1537   MCHC 31.7 04/02/2022 1537   RDW 13.2 04/02/2022 1537   LYMPHSABS 2.1 11/08/2021 1744   MONOABS 0.4 11/08/2021 1744   EOSABS 0.3 11/08/2021 1744   BASOSABS 0.0 11/08/2021 1744    Hepatic Function      Component Value Date/Time   PROT 7.2 11/08/2021 1744   ALBUMIN 3.8 11/08/2021 1744   AST 16 11/08/2021 1744   ALT 8 11/08/2021 1744   ALKPHOS 79 11/08/2021 1744   BILITOT 0.3 11/08/2021 1744    Renal Function   Lab Results  Component Value Date   CREATININE 0.89 04/02/2022   CREATININE 1.02 (H) 02/05/2022   CREATININE 0.91 11/08/2021    Estimated Creatinine Clearance: 69 mL/min (by C-G formula based on SCr of 0.89 mg/dL).   VVS Vascular Lab Studies:  04/02/22 venous dopplers IMPRESSION: 1. Occlusive deep venous thrombosis of the left popliteal vein. The remaining left lower extremity venous structures are unremarkable. 2. No evidence of right lower extremity DVT.  ASSESSMENT: Location of DVT: Left popliteal vein Cause of DVT: unprovoked. 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 Was supposed to remain on Xarelto but self discontinued due to cost. No known cause per patient history for this DVT. Costs have come down and patient says $47/month will be affordable for her. Will find other solutions as needed when the patient reaches the donut hole. She sees her oncologist/hematologist this week and is going to ask which thrombophilia she was found to have.   PLAN: -Continue rivaroxaban (Xarelto) 15 mg twice daily with food for 21 days followed by 20 mg daily with food. -Expected duration of therapy: Indefinite. Therapy started on 04/03/22. -Patient educated on purpose, proper use and potential adverse effects of rivaroxaban (Xarelto). -Discussed  importance of taking medication around the same time every day. -Advised patient  of medications to avoid (NSAIDs, aspirin doses >100 mg daily). -Educated that Tylenol (acetaminophen) is the preferred analgesic to lower the risk of bleeding. -Advised patient to alert all providers of anticoagulation therapy prior to starting a new medication or having a procedure. -Emphasized importance of monitoring for signs and symptoms of bleeding (abnormal bruising, prolonged bleeding, nose bleeds, bleeding from gums, discolored urine, black tarry stools). -Educated patient to present to the ED if emergent signs and symptoms of new thrombosis occur. -Counseled patient to wear compression stockings daily, removing at night. Encouraged elevation as well to help with swelling.   Follow up: 1 month in DVT Clinic.   Rebbeca Paul, PharmD, Para [place], CPP [facility] Clinical Pharmacist Practitioner Office: [phone]

## 2022-05-08 ENCOUNTER — Encounter (HOSPITAL_COMMUNITY): Payer: Self-pay

## 2022-05-08 ENCOUNTER — Ambulatory Visit (HOSPITAL_COMMUNITY)
Admission: RE | Admit: 2022-05-08 | Discharge: 2022-05-08 | Disposition: A | Discharge status: Home / Self Care | Payer: Medicare Other | Source: Ambulatory Visit | Attending: Surgery | Admitting: Surgery

## 2022-05-08 VITALS — BP 133/73 | HR 81

## 2022-05-08 DIAGNOSIS — I82432 Acute embolism and thrombosis of left popliteal vein: Secondary | ICD-10-CM

## 2022-05-08 NOTE — Progress Notes (Addendum)
DVT Clinic Note  Name: Amanda Peterson     MRN: [id number]     DOB: 1955/02/14     Sex: female  PCP: Rema Jasmine, NP  Today's Visit: Visit Information: Follow Up Visit  Referred to DVT Clinic by: Delice Bison, PA-C [facility] ED)   Referred to CPP by: Dr. Myra Gianotti Reason for referral:  Chief Complaint  Patient presents with   Med Management - DVT   HISTORY OF PRESENT ILLNESS:  Amanda Peterson is a 67 y.o. female with PMH DVT (2016, 2021, 2024), PE (2021), post-thrombotic syndrome, breast cancer (in remission), anxiety, chronic neck and back pain, who presents accompanied by her husband for follow up medication management after diagnosis of DVT on 04/02/22. DVT in 2016 was s/p knee surgery. In 2021, she was found to have a LLE DVT and PE s/p COVID infection. Treatment was complicated by a GI bleed. IVC filter was placed and later retrieved. GI bleed resolved on its own with no known cause. She also reported that her oncologist found a lab marker making her more susceptible to clotting but she doesn't know what it was called. Anticoagulation was resumed by oncology at that time to reduce future VTE risk, but the patient ultimately discontinued Xarelto due to cost (was $450/month). She was then found to have a new DVT involving her L popliteal vein. Last seen in DVT Clinic 04/08/22 at which time Xarelto refills were provided. Cost of Xarelto has come down and is now affordable for the patient.   Today, patient reports she continues to have pain in both legs. Describes this pain as a shooting, tingling pain. She thinks it may be related to her previous back surgery and reports duration of pain >1 year. She has seen her PCP for this who has referred her to neurology, whom she sees in the next few weeks. She does still have some pain behind the knee as well. Denies missed doses of Xarelto. She is wearing compression stockings bilaterally. Reports she saw blood in her urine a couple weeks ago. She was seen  by urology who ran tests and did not find anything concerning. She has not had any repeat hematuria since. No other bruising or bleeding concerns.   Positive Thrombotic Risk Factors: Previous VTE, Known thrombophilic condition, Older Age Bleeding Risk Factors: Age >65 years, Anticoagulant therapy, Anemia  Negative Thrombotic Risk Factors: Recent surgery (within 3 months), Recent trauma (within 3 months), Recent admission to hospital with acute illness (within 3 months), Paralysis, paresis, or recent plaster cast immobilization of lower extremity, Sedentary journey lasting >8 hours within 4 weeks, Bed rest >72 hours within 3 months, Central venous catheterization, Pregnancy, Testosterone therapy, Active cancer, Estrogen therapy, Smoking, Obesity, Recent COVID diagnosis (within 3 months), Recent cesarean section (within 3 months), Within 6 weeks postpartum, Erythropoiesis-stimulating agent, Non-malignant, chronic inflammatory condition  Rx Insurance Coverage: Medicare Rx Affordability: Xarelto copay is $47/month which the patient says is affordable. Preferred Pharmacy: Refills have been sent to [facility] in [place], [place]  Past Medical History:  Diagnosis Date   Anxiety    Breast cancer    Chronic back pain    Chronic neck pain    Depression    High cholesterol    History of suicidal ideation    Hypertension    Lumbar radiculopathy     Past Surgical History:  Procedure Laterality Date   BACK SURGERY     BREAST LUMPECTOMY     CERVICAL DISC SURGERY     SHOULDER  SURGERY     THYROID SURGERY Right     Social History   Socioeconomic History   Marital status: Married    Spouse name: Not on file   Number of children: Not on file   Years of education: Not on file   Highest education level: Not on file  Occupational History   Not on file  Tobacco Use   Smoking status: Never   Smokeless tobacco: Never  Vaping Use   Vaping Use: Never used  Substance and Sexual Activity   Alcohol use: No    Drug use: No   Sexual activity: Not on file  Other Topics Concern   Not on file  Social History Narrative   Not on file   Social Determinants of Health   Financial Resource Strain: Not on file  Food Insecurity: Not on file  Transportation Needs: Not on file  Physical Activity: Not on file  Stress: Not on file  Social Connections: Not on file  Intimate Partner Violence: Not on file    No family history on file.  Allergies as of 05/08/2022 - Review Complete 05/08/2022  Allergen Reaction Noted   Hydrocodone Anaphylaxis 07/22/2021   Tramadol Anaphylaxis 07/22/2021   Oxycodone Other (See Comments) 11/21/2012   Codeine Rash 11/21/2012   Darvocet [propoxyphene n-acetaminophen] Rash 11/21/2012    Current Outpatient Medications on File Prior to Encounter  Medication Sig Dispense Refill   acetaminophen (TYLENOL) 500 MG tablet Take 1,000 mg by mouth every 8 (eight) hours as needed for mild pain or moderate pain.     bisacodyl (BISACODYL) 5 MG EC tablet Take 5 mg by mouth daily as needed for moderate constipation.     DULoxetine (CYMBALTA) 60 MG capsule Take 60 mg by mouth daily.      ezetimibe (ZETIA) 10 MG tablet Take 10 mg by mouth daily.     furosemide (LASIX) 20 MG tablet Take 1 tablet by mouth daily.     gabapentin (NEURONTIN) 600 MG tablet Take 600 mg by mouth 3 (three) times daily.     glipiZIDE (GLUCOTROL XL) 2.5 MG 24 hr tablet Take 2.5 mg by mouth daily with breakfast.     hydrochlorothiazide (HYDRODIURIL) 12.5 MG tablet Take 12.5 mg by mouth daily.     levocetirizine (XYZAL) 5 MG tablet Take 5 mg by mouth daily.     lubiprostone (AMITIZA) 24 MCG capsule Take 24 mcg by mouth 2 (two) times daily.     metoprolol succinate (TOPROL-XL) 50 MG 24 hr tablet Take 50 mg by mouth every morning. Take with or immediately following a meal.     rivaroxaban (XARELTO) 20 MG TABS tablet Take 1 tablet (20 mg total) by mouth daily with supper. Take with food. Start taking after completion of  starter pack. 30 tablet 5   sucralfate (CARAFATE) 1 g tablet Take 1 g by mouth 4 (four) times daily.     tiZANidine (ZANAFLEX) 2 MG tablet Take 1 tablet (2 mg total) by mouth every 6 (six) hours as needed for muscle spasms. 8 tablet 0   zolpidem (AMBIEN) 5 MG tablet Take 10 mg by mouth at bedtime as needed for sleep.     triamcinolone (NASACORT) 55 MCG/ACT AERO nasal inhaler as needed. (Patient not taking: Reported on 05/08/2022)     No current facility-administered medications on file prior to encounter.   REVIEW OF SYSTEMS:  Review of Systems  Respiratory:  Negative for shortness of breath.   Cardiovascular:  Positive for leg swelling (  bilateral). Negative for chest pain and palpitations.  Gastrointestinal:  Negative for blood in stool and melena.  Genitourinary:  Negative for hematuria.  Musculoskeletal:  Positive for myalgias.  Neurological:  Positive for tingling (both feet, shooting up legs). Negative for dizziness.   PHYSICAL EXAMINATION:  Vitals:   05/08/22 0955  BP: 133/73  Pulse: 81  SpO2: 100%    Physical Exam Cardiovascular:     Rate and Rhythm: Normal rate.  Pulmonary:     Effort: Pulmonary effort is normal.  Musculoskeletal:        General: Tenderness present.     Right lower leg: Edema (1+) present.     Left lower leg: Edema (1+) present.  Psychiatric:        Mood and Affect: Mood normal.        Behavior: Behavior normal.        Thought Content: Thought content normal.   Villalta Score for Post-Thrombotic Syndrome: Pain: Severe Cramps: Severe Heaviness: Severe Paresthesia: Severe Pruritus: Mild Pretibial Edema: Mild Skin Induration: Absent Hyperpigmentation: Absent Redness: Absent Venous Ectasia: Absent Pain on calf compression: Mild Villalta Preliminary Score: 15 Is venous ulcer present?: No If venous ulcer is present and score is <15, then 15 points total are assigned: Absent Villalta Total Score: 15  LABS:  CBC     Component Value Date/Time    WBC 4.7 04/02/2022 1537   RBC 4.04 04/02/2022 1537   HGB 11.5 (L) 04/02/2022 1537   HCT 36.3 04/02/2022 1537   PLT 279 04/02/2022 1537   MCV 89.9 04/02/2022 1537   MCH 28.5 04/02/2022 1537   MCHC 31.7 04/02/2022 1537   RDW 13.2 04/02/2022 1537   LYMPHSABS 2.1 11/08/2021 1744   MONOABS 0.4 11/08/2021 1744   EOSABS 0.3 11/08/2021 1744   BASOSABS 0.0 11/08/2021 1744    Hepatic Function      Component Value Date/Time   PROT 7.2 11/08/2021 1744   ALBUMIN 3.8 11/08/2021 1744   AST 16 11/08/2021 1744   ALT 8 11/08/2021 1744   ALKPHOS 79 11/08/2021 1744   BILITOT 0.3 11/08/2021 1744    Renal Function   Lab Results  Component Value Date   CREATININE 0.89 04/02/2022   CREATININE 1.02 (H) 02/05/2022   CREATININE 0.91 11/08/2021    CrCl cannot be calculated (Patient's most recent lab result is older than the maximum 21 days allowed.).   VVS Vascular Lab Studies:  04/02/22 venous dopplers IMPRESSION: 1. Occlusive deep venous thrombosis of the left popliteal vein. The remaining left lower extremity venous structures are unremarkable. 2. No evidence of right lower extremity DVT.  ASSESSMENT: Location of DVT: Left popliteal vein Cause of DVT: unprovoked. 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 She reports a history of thrombophilia found by oncology with the intention of continuing Xarelto indefinitely, but the patient self-discontinued due to cost. Now with a new unprovoked DVT. Patient is now able to afford Xarelto and reports adherence. Will continue Xarelto indefinitely, as long as benefits outweigh risks. She has seen her oncologist since last visit who is in agreement with the plan.   Edema appears to be at baseline bilaterally. Patient has previously been diagnosed with post-thrombotic syndrome from her previous two DVTs. Suspect some of her current pain is related to this DVT, but she does describe a nerve pain in both legs that has been present >1 year and  seems to be causing her more pain at this time than the pain behind the knee.  She is seeing a neurologist for further evaluation in the next few weeks.   PLAN: -Continue rivaroxaban (Xarelto) 20 mg daily with food. -Expected duration of therapy: Indefinite. Therapy started on 04/03/22. -Patient educated on purpose, proper use and potential adverse effects of rivaroxaban (Xarelto). -Discussed importance of taking medication around the same time every day. -Advised patient of medications to avoid (NSAIDs, aspirin doses >100 mg daily). -Educated that Tylenol (acetaminophen) is the preferred analgesic to lower the risk of bleeding. -Advised patient to alert all providers of anticoagulation therapy prior to starting a new medication or having a procedure. -Emphasized importance of monitoring for signs and symptoms of bleeding (abnormal bruising, prolonged bleeding, nose bleeds, bleeding from gums, discolored urine, black tarry stools). -Educated patient to present to the ED if emergent signs and symptoms of new thrombosis occur. -Counseled patient to wear compression stockings daily, removing at night.  Follow up: 2 months in DVT Clinic.   Pervis HockingMadison Yates, PharmD, LoomisBCACP, CPP [facility] Clinical Pharmacist Practitioner Office: [phone]  I have evaluated the patient's chart/imaging and refer this patient to the Clinical Pharmacist Practitioner for medication management. I have reviewed the CPP's documentation and agree with her assessment and plan. I was immediately available during the visit for questions and collaboration.   Durene CalWells Jamala Kohen, MD

## 2022-05-08 NOTE — Patient Instructions (Signed)
-  Continue rivaroxaban (Xarelto) 20 mg daily with food. -Your refills have been sent to your [facility]. You may need to call the pharmacy to ask them to fill this when you start to run low on your current supply.  -It is important to take your medication around the same time every day.  -Avoid NSAIDs like ibuprofen (Advil, Motrin) and naproxen (Aleve) as well as aspirin doses over 100 mg daily. -Tylenol (acetaminophen) is the preferred over the counter pain medication to lower the risk of bleeding. -Be sure to alert all of your health care providers that you are taking an anticoagulant prior to starting a new medication or having a procedure. -Monitor for signs and symptoms of bleeding (abnormal bruising, prolonged bleeding, nose bleeds, bleeding from gums, discolored urine, black tarry stools). If you have fallen and hit your head OR if your bleeding is severe or not stopping, seek emergency care.  -Go to the emergency room if emergent signs and symptoms of new clot occur (new or worse swelling and pain in an arm or leg, shortness of breath, chest pain, fast or irregular heartbeats, lightheadedness, dizziness, fainting, coughing up blood) or if you experience a significant color change (pale or blue) in the extremity that has the DVT.  -We recommend you wear compression stockings (20-30 mmHg) as long as you are having swelling or pain. Be sure to purchase the correct size and take them off at night.   Your next visit will be in 2 months. I will call you to schedule this.  [facility] [address] [place], [place], [place] [address] Enter the hospital through Entrance C off [address] and pull up to the Heart [facility] Vascular Center entrance to the free valet parking.  Check in for your appointment at the [facility] V[facility] Center.   If you have any questions or need to reschedule an appointment, please call [phone] [facility].  If you are having an emergency, call 911 or present to  the nearest emergency room.   What is a DVT?  -Deep vein thrombosis (DVT) is a condition in which a blood clot forms in a vein of the deep venous system which can occur in the lower leg, thigh, pelvis, arm, or neck. This condition is serious and can be life-threatening if the clot travels to the arteries of the lungs and causing a blockage (pulmonary embolism, PE). A DVT can also damage veins in the leg, which can lead to long-term venous disease, leg pain, swelling, discoloration, and ulcers or sores (post-thrombotic syndrome).  -Treatment may include taking an anticoagulant medication to prevent more clots from forming and the current clot from growing, wearing compression stockings, and/or surgical procedures to remove or dissolve the clot.

## 2022-06-06 ENCOUNTER — Telehealth (HOSPITAL_COMMUNITY): Payer: Self-pay | Admitting: Student-PharmD

## 2022-06-06 NOTE — Telephone Encounter (Signed)
Attempted call to patient to schedule next follow up visit in DVT Clinic. Left voicemail requesting call back to [phone].

## 2022-06-06 NOTE — Telephone Encounter (Signed)
Patient returned call and is now scheduled for her follow up in June.

## 2022-06-12 ENCOUNTER — Other Ambulatory Visit: Payer: Self-pay | Admitting: Nurse Practitioner

## 2022-06-12 DIAGNOSIS — I739 Peripheral vascular disease, unspecified: Secondary | ICD-10-CM

## 2022-07-02 ENCOUNTER — Other Ambulatory Visit: Payer: Self-pay | Admitting: Nurse Practitioner

## 2022-07-02 ENCOUNTER — Ambulatory Visit
Admission: RE | Admit: 2022-07-02 | Discharge: 2022-07-02 | Disposition: A | Discharge status: Home / Self Care | Payer: Medicare Other | Source: Ambulatory Visit | Attending: Nurse Practitioner | Admitting: Nurse Practitioner

## 2022-07-02 DIAGNOSIS — I824Y9 Acute embolism and thrombosis of unspecified deep veins of unspecified proximal lower extremity: Secondary | ICD-10-CM

## 2022-07-10 ENCOUNTER — Ambulatory Visit (HOSPITAL_COMMUNITY)
Admission: RE | Admit: 2022-07-10 | Discharge: 2022-07-10 | Disposition: A | Discharge status: Home / Self Care | Payer: Medicare Other | Source: Ambulatory Visit | Attending: Vascular Surgery | Admitting: Vascular Surgery

## 2022-07-10 ENCOUNTER — Encounter (HOSPITAL_COMMUNITY): Payer: Self-pay | Admitting: Student-PharmD

## 2022-07-10 VITALS — BP 123/60 | HR 54

## 2022-07-10 DIAGNOSIS — I82432 Acute embolism and thrombosis of left popliteal vein: Secondary | ICD-10-CM | POA: Insufficient documentation

## 2022-07-10 NOTE — Progress Notes (Signed)
DVT Clinic Note  Name: Amanda Peterson     MRN: [id number]     DOB: 09/27/1955     Sex: female  PCP: Karenann Cai, NP  Today's Visit: Visit Information: Discharge Visit  Referred to DVT Clinic by: Delice Bison, PA-C [facility])    Referred to CPP by: Dr. Lenell Antu Reason for referral:  Chief Complaint  Patient presents with   Med Management - DVT   HISTORY OF PRESENT ILLNESS: Amanda Peterson is a 67 y.o. female with PMH DVT (2016, 2021, 2024), PE (2021), post-thrombotic syndrome, breast cancer (in remission), anxiety, chronic neck and back pain, who presents for follow up medication management after diagnosis of DVT on 04/02/22.  DVT in 2016 was s/p knee surgery. In 2021, she was found to have a LLE DVT and PE s/p COVID infection. Treatment was complicated by a GI bleed. IVC filter was placed and later retrieved. GI bleed resolved on its own with no known cause. She also reported that her oncologist found a lab marker making her more susceptible to clotting but she doesn't know what it was called. Anticoagulation was resumed by oncology at that time to reduce future VTE risk, but the patient ultimately discontinued Xarelto due to cost (was $450/month). She was then found to have a new DVT involving her L popliteal vein on 04/02/22. She was restarted on Xarelto, and the cost has come down to now be affordable for the patient.   Today patient is accompanied by her husband. Reports that she is still having severe nerve pain that she has been dealing with for >1 year. She said she has a new PCP who is running tests to see if she has PVD. Pain related to the DVT appears improved. She has been adherent to Xarelto. Denies issues with cost or access at the pharmacy. Denies abnormal bleeding or bruising. Endorses wearing compression stockings and feels that her swelling is at baseline.   Positive Thrombotic Risk Factors: Previous VTE, Known thrombophilic condition, Older Age Bleeding Risk Factors: Age  >65 years, Anticoagulant therapy, Anemia  Negative Thrombotic Risk Factors: Recent surgery (within 3 months), Recent trauma (within 3 months), Recent admission to hospital with acute illness (within 3 months), Paralysis, paresis, or recent plaster cast immobilization of lower extremity, Central venous catheterization, Bed rest >72 hours within 3 months, Sedentary journey lasting >8 hours within 4 weeks, Pregnancy, Within 6 weeks postpartum, Recent cesarean section (within 3 months), Estrogen therapy, Testosterone therapy, Erythropoiesis-stimulating agent, Recent COVID diagnosis (within 3 months), Active cancer, Non-malignant, chronic inflammatory condition, Smoking, Obesity  Rx Insurance Coverage: Medicare Rx Affordability: Xarelto copay is $47/month which the patient says is affordable.  Preferred Pharmacy: Refills have been sent to [facility] in [place], [place]   Past Medical History:  Diagnosis Date   Anxiety    Breast cancer (HCC)    Chronic back pain    Chronic neck pain    Depression    High cholesterol    History of suicidal ideation    Hypertension    Lumbar radiculopathy     Past Surgical History:  Procedure Laterality Date   BACK SURGERY     BREAST LUMPECTOMY     CERVICAL DISC SURGERY     SHOULDER SURGERY     THYROID SURGERY Right     Social History   Socioeconomic History   Marital status: Married    Spouse name: Not on file   Number of children: Not on file   Years of education:  Not on file   Highest education level: Not on file  Occupational History   Not on file  Tobacco Use   Smoking status: Never   Smokeless tobacco: Never  Vaping Use   Vaping Use: Never used  Substance and Sexual Activity   Alcohol use: No   Drug use: No   Sexual activity: Not on file  Other Topics Concern   Not on file  Social History Narrative   Not on file   Social Determinants of Health   Financial Resource Strain: Not on file  Food Insecurity: Not on file  Transportation Needs:  Not on file  Physical Activity: Not on file  Stress: Not on file  Social Connections: Not on file  Intimate Partner Violence: Not on file    No family history on file.  Allergies as of 07/10/2022 - Review Complete 07/10/2022  Allergen Reaction Noted   Hydrocodone Anaphylaxis 07/22/2021   Tramadol Anaphylaxis 07/22/2021   Oxycodone Other (See Comments) 11/21/2012   Codeine Rash 11/21/2012   Darvocet [propoxyphene n-acetaminophen] Rash 11/21/2012    Current Outpatient Medications on File Prior to Encounter  Medication Sig Dispense Refill   acetaminophen (TYLENOL) 500 MG tablet Take 1,000 mg by mouth every 8 (eight) hours as needed for mild pain or moderate pain.     ALPRAZolam (XANAX) 1 MG tablet Take 1 mg by mouth at bedtime as needed for sleep.     bisacodyl (BISACODYL) 5 MG EC tablet Take 5 mg by mouth daily as needed for moderate constipation.     DULoxetine (CYMBALTA) 60 MG capsule Take 60 mg by mouth daily.      ezetimibe (ZETIA) 10 MG tablet Take 10 mg by mouth daily.     furosemide (LASIX) 20 MG tablet Take 1 tablet by mouth daily.     gabapentin (NEURONTIN) 300 MG capsule Take 300 mg by mouth daily. Takes in the evening in addition to 600 mg dose     gabapentin (NEURONTIN) 600 MG tablet Take 600 mg by mouth 3 (three) times daily.     glipiZIDE (GLUCOTROL XL) 2.5 MG 24 hr tablet Take 2.5 mg by mouth daily with breakfast.     hydrochlorothiazide (HYDRODIURIL) 12.5 MG tablet Take 12.5 mg by mouth daily.     levocetirizine (XYZAL) 5 MG tablet Take 5 mg by mouth daily.     lubiprostone (AMITIZA) 24 MCG capsule Take 24 mcg by mouth 2 (two) times daily.     metoprolol succinate (TOPROL-XL) 50 MG 24 hr tablet Take 50 mg by mouth every morning. Take with or immediately following a meal.     rivaroxaban (XARELTO) 20 MG TABS tablet Take 1 tablet (20 mg total) by mouth daily with supper. Take with food. Start taking after completion of starter pack. 30 tablet 5   rOPINIRole (REQUIP) 0.25 MG  tablet Take 0.25 mg by mouth at bedtime.     sucralfate (CARAFATE) 1 g tablet Take 1 g by mouth 4 (four) times daily.     tiZANidine (ZANAFLEX) 2 MG tablet Take 1 tablet (2 mg total) by mouth every 6 (six) hours as needed for muscle spasms. 8 tablet 0   zolpidem (AMBIEN) 5 MG tablet Take 10 mg by mouth at bedtime as needed for sleep.     No current facility-administered medications on file prior to encounter.   REVIEW OF SYSTEMS:  Review of Systems  Respiratory:  Negative for shortness of breath.   Cardiovascular:  Negative for chest pain, palpitations and  leg swelling.  Musculoskeletal:  Positive for myalgias.  Neurological:  Positive for tingling. Negative for dizziness.   PHYSICAL EXAMINATION:  Vitals:   07/10/22 1004  BP: 123/60  Pulse: (!) 54  SpO2: 100%   Physical Exam Vitals reviewed.  Cardiovascular:     Rate and Rhythm: Bradycardia present.  Pulmonary:     Effort: Pulmonary effort is normal.  Musculoskeletal:        General: Swelling (1+ pitting edema bilaterally) and tenderness present.  Skin:    Findings: No bruising or erythema.  Psychiatric:        Mood and Affect: Mood normal.        Behavior: Behavior normal.        Thought Content: Thought content normal.   Villalta Score for Post-Thrombotic Syndrome: Pain: Severe Cramps: Severe Heaviness: Moderate Paresthesia: Severe Pruritus: Severe Pretibial Edema: Mild Skin Induration: Absent Hyperpigmentation: Absent Redness: Absent Venous Ectasia: Absent Pain on calf compression: Mild Villalta Preliminary Score: 16 Is venous ulcer present?: No If venous ulcer is present and score is <15, then 15 points total are assigned: Absent Villalta Total Score: 16  LABS:  CBC     Component Value Date/Time   WBC 4.7 04/02/2022 1537   RBC 4.04 04/02/2022 1537   HGB 11.5 (L) 04/02/2022 1537   HCT 36.3 04/02/2022 1537   PLT 279 04/02/2022 1537   MCV 89.9 04/02/2022 1537   MCH 28.5 04/02/2022 1537   MCHC 31.7  04/02/2022 1537   RDW 13.2 04/02/2022 1537   LYMPHSABS 2.1 11/08/2021 1744   MONOABS 0.4 11/08/2021 1744   EOSABS 0.3 11/08/2021 1744   BASOSABS 0.0 11/08/2021 1744    Hepatic Function      Component Value Date/Time   PROT 7.2 11/08/2021 1744   ALBUMIN 3.8 11/08/2021 1744   AST 16 11/08/2021 1744   ALT 8 11/08/2021 1744   ALKPHOS 79 11/08/2021 1744   BILITOT 0.3 11/08/2021 1744    Renal Function   Lab Results  Component Value Date   CREATININE 0.89 04/02/2022   CREATININE 1.02 (H) 02/05/2022   CREATININE 0.91 11/08/2021    CrCl cannot be calculated (Patient's most recent lab result is older than the maximum 21 days allowed.).   VVS Vascular Lab Studies:  04/02/22 venous dopplers IMPRESSION: 1. Occlusive deep venous thrombosis of the left popliteal vein. The remaining left lower extremity venous structures are unremarkable. 2. No evidence of right lower extremity DVT.  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 She reports a history of thrombophilia found by oncology with the intention of continuing Xarelto indefinitely, but the patient self-discontinued due to cost. Now with a new unprovoked DVT. Patient is now able to afford Xarelto and reports adherence. Will continue Xarelto indefinitely, as long as benefits outweigh risks. She has seen her oncologist who is in agreement with this plan.   Edema today appears to be at her baseline bilaterally. Patient has previously been diagnosed with post-thrombotic syndrome from her previous two DVTs. She is still having severe nerve pain in both legs that has been present >1 year and is being worked up by her new PCP. Pain related to the DVT appears improved from diagnosis. She has tolerated Xarelto well with no adverse effects. Will discharge patient from DVT Clinic today but encouraged patient to reach out if she experiences any cost issues to ensure  she does not self-discontinue Xarelto again in the  future. I suspect she may reach the donut hole near the end of this year and do not want her to discontinue treatment due to cost. The donut hole will no longer exist in 2025 so this is the only year I suspect this issue could come up.   PLAN: -Continue rivaroxaban (Xarelto) 20 mg daily with food. -Expected duration of therapy: Indefinite. Therapy started on 04/03/22. -Patient educated on purpose, proper use and potential adverse effects of rivaroxaban (Xarelto). -Discussed importance of taking medication around the same time every day. -Advised patient of medications to avoid (NSAIDs, aspirin doses >100 mg daily). -Educated that Tylenol (acetaminophen) is the preferred analgesic to lower the risk of bleeding. -Advised patient to alert all providers of anticoagulation therapy prior to starting a new medication or having a procedure. -Emphasized importance of monitoring for signs and symptoms of bleeding (abnormal bruising, prolonged bleeding, nose bleeds, bleeding from gums, discolored urine, black tarry stools). -Educated patient to present to the ED if emergent signs and symptoms of new thrombosis occur. -Counseled patient to wear compression stockings daily, removing at night. -Patient is discharged from the DVT Clinic. -Patient has been given refills to last 6 months. Will defer future refills to patient's PCP.  -Encouraged the patient to reach out to me if she experiences any issues with cost that prohibit her continued adherence to Xarelto.   Follow up: with PCP. No further follow up needed in DVT Clinic at this time.   Pervis Hocking, PharmD, [place], CPP [facility] Clinical Pharmacist Practitioner Office: [phone]

## 2022-07-10 NOTE — Patient Instructions (Signed)
You have been discharged from the DVT Clinic! No further follow up in the DVT Clinic is needed.  -Continue Xarelto 20 mg daily with food -You have been provided with 6 months of refills, sent to your [facility] in [place].  -Please reach out to me if you run into any trouble with affording your Xarelto.  -Your primary care provider, Diamantina Monks, has been informed of your discharge from the DVT Clinic. All future refills will be the responsibility of your primary care provider.  -It is important that you schedule and attend a follow up visit with your primary care provider within the next couple of months to ensure you do not run out of refills. If this is not already scheduled, we recommend you call to schedule this now.  -It is important to take your medication around the same time every day.  -Avoid NSAIDs like ibuprofen (Advil, Motrin) and naproxen (Aleve) as well as aspirin doses over 100 mg daily. -Tylenol (acetaminophen) is the preferred over the counter pain medication to lower the risk of bleeding. -Be sure to alert all of your health care providers that you are taking an anticoagulant prior to starting a new medication or having a procedure. -Monitor for signs and symptoms of bleeding (abnormal bruising, prolonged bleeding, nose bleeds, bleeding from gums, discolored urine, black tarry stools). If you have fallen and hit your head OR if your bleeding is severe or not stopping, seek emergency care.  -Go to the emergency room if emergent signs and symptoms of new clot occur (new or worse swelling and pain in an arm or leg, shortness of breath, chest pain, fast or irregular heartbeats, lightheadedness, dizziness, fainting, coughing up blood) or if you experience a significant color change (pale or blue) in the extremity that has the DVT.  -If you are having an emergency, call 911 or present to the nearest emergency room.   Please reach out if any questions come up. [phone] [facility].

## 2022-07-22 ENCOUNTER — Other Ambulatory Visit: Payer: Self-pay | Admitting: Nurse Practitioner

## 2022-07-22 ENCOUNTER — Ambulatory Visit
Admission: RE | Admit: 2022-07-22 | Discharge: 2022-07-22 | Disposition: A | Discharge status: Home / Self Care | Payer: Medicare Other | Source: Ambulatory Visit | Attending: Nurse Practitioner | Admitting: Nurse Practitioner

## 2022-07-22 DIAGNOSIS — I739 Peripheral vascular disease, unspecified: Secondary | ICD-10-CM

## 2022-08-10 ENCOUNTER — Other Ambulatory Visit: Payer: Self-pay

## 2022-08-10 ENCOUNTER — Encounter (HOSPITAL_COMMUNITY): Payer: Self-pay

## 2022-08-10 ENCOUNTER — Emergency Department (HOSPITAL_COMMUNITY)
Admission: EM | Admit: 2022-08-10 | Discharge: 2022-08-10 | Discharge status: Against Medical Advice | Payer: Medicare Other | Attending: Emergency Medicine | Admitting: Emergency Medicine

## 2022-08-10 DIAGNOSIS — Z7901 Long term (current) use of anticoagulants: Secondary | ICD-10-CM | POA: Diagnosis not present

## 2022-08-10 DIAGNOSIS — M7989 Other specified soft tissue disorders: Secondary | ICD-10-CM | POA: Diagnosis not present

## 2022-08-10 DIAGNOSIS — Z5321 Procedure and treatment not carried out due to patient leaving prior to being seen by health care provider: Secondary | ICD-10-CM | POA: Insufficient documentation

## 2022-08-10 DIAGNOSIS — M79605 Pain in left leg: Secondary | ICD-10-CM | POA: Insufficient documentation

## 2022-08-10 LAB — CBC
HCT: 37.7 % (ref 36.0–46.0)
Hemoglobin: 11.7 g/dL — ABNORMAL LOW (ref 12.0–15.0)
MCH: 27.4 pg (ref 26.0–34.0)
MCHC: 31 g/dL (ref 30.0–36.0)
MCV: 88.3 fL (ref 80.0–100.0)
Platelets: 381 10*3/uL (ref 150–400)
RBC: 4.27 MIL/uL (ref 3.87–5.11)
RDW: 14.3 % (ref 11.5–15.5)
WBC: 5.6 10*3/uL (ref 4.0–10.5)
nRBC: 0 % (ref 0.0–0.2)

## 2022-08-10 LAB — BASIC METABOLIC PANEL
Anion gap: 8 (ref 5–15)
BUN: 15 mg/dL (ref 8–23)
CO2: 28 mmol/L (ref 22–32)
Calcium: 9.1 mg/dL (ref 8.9–10.3)
Chloride: 102 mmol/L (ref 98–111)
Creatinine, Ser: 1.08 mg/dL — ABNORMAL HIGH (ref 0.44–1.00)
GFR, Estimated: 56 mL/min — ABNORMAL LOW (ref >60)
Glucose, Bld: 84 mg/dL (ref 70–99)
Potassium: 3.3 mmol/L — ABNORMAL LOW (ref 3.5–5.1)
Sodium: 138 mmol/L (ref 135–145)

## 2022-08-10 NOTE — ED Provider Triage Note (Signed)
Emergency Medicine Provider Triage Evaluation Note  Amanda Peterson , a 67 y.o. female  was evaluated in triage.  Pt complains of left leg pain.  Patient states that she has had radiating pain from her left low back/left buttock region down her left leg down to her heel.  States that she is on gabapentin for neuropathic type pain of which has helped some.  She also reports cramping type sensation in her left lower leg is similar to prior episode of having a DVT.  States that she has been compliant with his Xarelto at home.  Denies any saddle anesthesia, bowel/bladder dysfunction, weakness/sensory deficit lower extremities, history of IV drug use, prolonged corticosteroid use, known malignancy.  Denies any cough, chest pain, shortness of breath.  Review of Systems  Positive: See above Negative:   Physical Exam  BP 139/67 (BP Location: Right Arm)   Pulse 63   Temp 98.3 F (36.8 C) (Oral)   Resp 18   Ht 5' 5.5" (1.664 m)   Wt 89.8 kg   SpO2 100%   BMI 32.45 kg/m  Gen:   Awake, no distress   Resp:  Normal effort  MSK:   Moves extremities without difficulty  Other:  Tender to palpation left paraspinal lumbar region.  Muscular strength symmetric bilateral lower extremities.  No sensory deficits along major nerve distributions lower extremities.  1-2+ pitting edema bilateral  Medical Decision Making  Medically screening exam initiated at 4:39 PM.  Appropriate orders placed.  Ceriyah Nearing was informed that the remainder of the evaluation will be completed by another provider, this initial triage assessment does not replace that evaluation, and the importance of remaining in the ED until their evaluation is complete.     Peter Garter, [place] 08/10/22 [phone]

## 2022-08-10 NOTE — ED Triage Notes (Signed)
Pt reports  Left leg pain Swelling Hx of clots Shooting pain Radiating to back

## 2022-08-19 ENCOUNTER — Emergency Department (HOSPITAL_COMMUNITY)
Admission: EM | Admit: 2022-08-19 | Discharge: 2022-08-19 | Disposition: A | Discharge status: Home / Self Care | Payer: Medicare Other | Attending: Emergency Medicine | Admitting: Emergency Medicine

## 2022-08-19 ENCOUNTER — Encounter (HOSPITAL_COMMUNITY): Payer: Self-pay | Admitting: Emergency Medicine

## 2022-08-19 ENCOUNTER — Other Ambulatory Visit: Payer: Self-pay

## 2022-08-19 DIAGNOSIS — Z79899 Other long term (current) drug therapy: Secondary | ICD-10-CM | POA: Insufficient documentation

## 2022-08-19 DIAGNOSIS — R600 Localized edema: Secondary | ICD-10-CM | POA: Insufficient documentation

## 2022-08-19 DIAGNOSIS — R791 Abnormal coagulation profile: Secondary | ICD-10-CM | POA: Insufficient documentation

## 2022-08-19 DIAGNOSIS — R2243 Localized swelling, mass and lump, lower limb, bilateral: Secondary | ICD-10-CM | POA: Diagnosis present

## 2022-08-19 DIAGNOSIS — Z7901 Long term (current) use of anticoagulants: Secondary | ICD-10-CM | POA: Insufficient documentation

## 2022-08-19 DIAGNOSIS — E119 Type 2 diabetes mellitus without complications: Secondary | ICD-10-CM | POA: Insufficient documentation

## 2022-08-19 DIAGNOSIS — Z7984 Long term (current) use of oral hypoglycemic drugs: Secondary | ICD-10-CM | POA: Diagnosis not present

## 2022-08-19 DIAGNOSIS — I10 Essential (primary) hypertension: Secondary | ICD-10-CM | POA: Insufficient documentation

## 2022-08-19 LAB — BASIC METABOLIC PANEL
Anion gap: 8 (ref 5–15)
BUN: 11 mg/dL (ref 8–23)
CO2: 26 mmol/L (ref 22–32)
Calcium: 9 mg/dL (ref 8.9–10.3)
Chloride: 103 mmol/L (ref 98–111)
Creatinine, Ser: 1 mg/dL (ref 0.44–1.00)
GFR, Estimated: >60 mL/min (ref >60)
Glucose, Bld: 88 mg/dL (ref 70–99)
Potassium: 4.1 mmol/L (ref 3.5–5.1)
Sodium: 137 mmol/L (ref 135–145)

## 2022-08-19 LAB — PROTIME-INR
INR: 1.6 — ABNORMAL HIGH (ref 0.8–1.2)
Prothrombin Time: 19.6 seconds — ABNORMAL HIGH (ref 11.4–15.2)

## 2022-08-19 LAB — CBC
HCT: 36.2 % (ref 36.0–46.0)
Hemoglobin: 11.2 g/dL — ABNORMAL LOW (ref 12.0–15.0)
MCH: 27.3 pg (ref 26.0–34.0)
MCHC: 30.9 g/dL (ref 30.0–36.0)
MCV: 88.1 fL (ref 80.0–100.0)
Platelets: 344 10*3/uL (ref 150–400)
RBC: 4.11 MIL/uL (ref 3.87–5.11)
RDW: 14.4 % (ref 11.5–15.5)
WBC: 5.3 10*3/uL (ref 4.0–10.5)
nRBC: 0 % (ref 0.0–0.2)

## 2022-08-19 LAB — BRAIN NATRIURETIC PEPTIDE: B Natriuretic Peptide: 26 pg/mL (ref 0.0–100.0)

## 2022-08-19 MED ORDER — FUROSEMIDE 10 MG/ML IJ SOLN
40.0000 mg | Freq: Once | INTRAMUSCULAR | Status: AC
  Administered 2022-08-19: 40 mg via INTRAVENOUS

## 2022-08-19 MED ORDER — FUROSEMIDE 10 MG/ML IJ SOLN
INTRAMUSCULAR | Status: AC
  Filled 2022-08-19: qty 4

## 2022-08-19 MED ORDER — FUROSEMIDE 40 MG PO TABS: 20.0000 mg | ORAL_TABLET | Freq: Every day | ORAL | 1 refills | Status: DC

## 2022-08-19 NOTE — ED Provider Notes (Signed)
[place] EMERGENCY DEPARTMENT AT [facility] Provider Note   CSN: [id number] Arrival date & time: 08/19/22  1457     History {Add pertinent medical, surgical, social history, OB history to HPI:1} Chief Complaint  Patient presents with   Leg Swelling    Amanda Peterson is a 67 y.o. female.  Patient with history of hypertension and diabetes.  She comes in complaining of swelling to both her legs.   Leg Pain      Home Medications Prior to Admission medications   Medication Sig Start Date End Date Taking? Authorizing Provider  acetaminophen (TYLENOL) 500 MG tablet Take 1,000 mg by mouth every 8 (eight) hours as needed for mild pain or moderate pain.    [provider]  ALPRAZolam Prudy Feeler) 1 MG tablet Take 1 mg by mouth at bedtime as needed for sleep. 05/21/22   [provider]  bisacodyl (BISACODYL) 5 MG EC tablet Take 5 mg by mouth daily as needed for moderate constipation.    [provider]  DULoxetine (CYMBALTA) 60 MG capsule Take 60 mg by mouth daily.     [provider]  ezetimibe (ZETIA) 10 MG tablet Take 10 mg by mouth daily. 05/16/20   [provider]  furosemide (LASIX) 20 MG tablet Take 1 tablet by mouth daily.    [provider]  gabapentin (NEURONTIN) 300 MG capsule Take 300 mg by mouth daily. Takes in the evening in addition to 600 mg dose 07/02/22   [provider]  gabapentin (NEURONTIN) 600 MG tablet Take 600 mg by mouth 3 (three) times daily. 06/18/21   [provider]  glipiZIDE (GLUCOTROL XL) 2.5 MG 24 hr tablet Take 2.5 mg by mouth daily with breakfast.    [provider]  hydrochlorothiazide (HYDRODIURIL) 12.5 MG tablet Take 12.5 mg by mouth daily.    [provider]  levocetirizine (XYZAL) 5 MG tablet Take 5 mg by mouth daily.    [provider]  lubiprostone (AMITIZA) 24 MCG capsule Take 24 mcg by mouth 2 (two) times daily. 01/23/22   [provider]  metoprolol succinate (TOPROL-XL) 50 MG 24 hr tablet Take 50 mg by mouth every morning. Take with or immediately following a meal.    [provider]  rivaroxaban (XARELTO) 20 MG TABS tablet Take 1 tablet (20 mg total) by mouth daily with supper. Take with food. Start taking after completion of starter pack. 04/08/22   Victorino Sparrow, MD  rOPINIRole (REQUIP) 0.25 MG tablet Take 0.25 mg by mouth at bedtime. 06/11/22   [provider]  sucralfate (CARAFATE) 1 g tablet Take 1 g by mouth 4 (four) times daily. 03/19/22   [provider]  tiZANidine (ZANAFLEX) 2 MG tablet Take 1 tablet (2 mg total) by mouth every 6 (six) hours as needed for muscle spasms. 11/08/21   Benjiman Core, MD  zolpidem (AMBIEN) 5 MG tablet Take 10 mg by mouth at bedtime as needed for sleep.    [provider]      Allergies    Hydrocodone, Tramadol, Oxycodone, Codeine, and Darvocet [propoxyphene n-acetaminophen]    Review of Systems   Review of Systems  Physical Exam Updated Vital Signs BP (!) 150/87   Pulse (!) 56   Temp 97.8 F (36.6 C) (Oral)   Resp 17   Ht 5' 5.5" (1.664 m)   Wt 87.1 kg   SpO2 99%   BMI 31.46 kg/m  Physical Exam  ED Results /  Procedures / Treatments   Labs (all labs ordered are listed, but only abnormal results are displayed) Labs Reviewed  CBC - Abnormal; Notable for the following components:      Result Value   Hemoglobin 11.2 (*)    All other components within normal limits  PROTIME-INR - Abnormal; Notable for the following components:   Prothrombin Time 19.6 (*)    INR 1.6 (*)    All other components within normal limits  BASIC METABOLIC PANEL  BRAIN NATRIURETIC PEPTIDE    EKG None  Radiology No results found.  Procedures Procedures  {Document cardiac monitor, telemetry assessment procedure when appropriate:1}  Medications Ordered in ED Medications  furosemide (LASIX) injection 40 mg (0 mg Intravenous Hold 08/19/22 1942)     ED Course/ Medical Decision Making/ A&P   {   Click here for ABCD2, HEART and other calculatorsREFRESH Note before signing :1}                          Medical Decision Making Amount and/or Complexity of Data Reviewed Labs: ordered.  Risk Prescription drug management.   Patient with edema bilaterally.  Patient improved with Lasix IV.  We will increase her Lasix to 40 mg a day and have her follow-up with her PCP next week  {Document critical care time when appropriate:1} {Document review of labs and clinical decision tools ie heart score, Chads2Vasc2 etc:1}  {Document your independent review of radiology images, and any outside records:1} {Document your discussion with family members, caretakers, and with consultants:1} {Document social determinants of health affecting pt's care:1} {Document your decision making why or why not admission, treatments were needed:1} Final Clinical Impression(s) / ED Diagnoses Final diagnoses:  Peripheral edema    Rx / DC Orders ED Discharge Orders     None

## 2022-08-19 NOTE — ED Triage Notes (Signed)
Pt via POV c/o bilateral lower leg swelling x 2 weeks with pain. PMH includes DVT; pt denies hx CHF. Pt takes xarelto daily. No SOB or CP.

## 2022-08-19 NOTE — Discharge Instructions (Addendum)
Take your lasix and follow up with your md next week

## 2022-11-04 ENCOUNTER — Other Ambulatory Visit: Payer: Self-pay | Admitting: Physical Medicine & Rehabilitation

## 2022-11-04 ENCOUNTER — Ambulatory Visit
Admission: RE | Admit: 2022-11-04 | Discharge: 2022-11-04 | Disposition: A | Discharge status: Home / Self Care | Payer: Medicare Other | Source: Ambulatory Visit | Attending: Physical Medicine & Rehabilitation | Admitting: Physical Medicine & Rehabilitation

## 2022-11-04 DIAGNOSIS — M5416 Radiculopathy, lumbar region: Secondary | ICD-10-CM

## 2022-11-04 DIAGNOSIS — M5451 Vertebrogenic low back pain: Secondary | ICD-10-CM

## 2022-11-12 IMAGING — DX DG HIP (WITH OR WITHOUT PELVIS) 2-3V*R*
3 series · 3 of 3 positions shown · non-contrast
Comparison: None.

CLINICAL DATA: Right hip pain and numbness for 2 weeks.  No injury.

EXAM:
DG HIP (WITH OR WITHOUT PELVIS) 2-3V RIGHT

[pelvis ap]
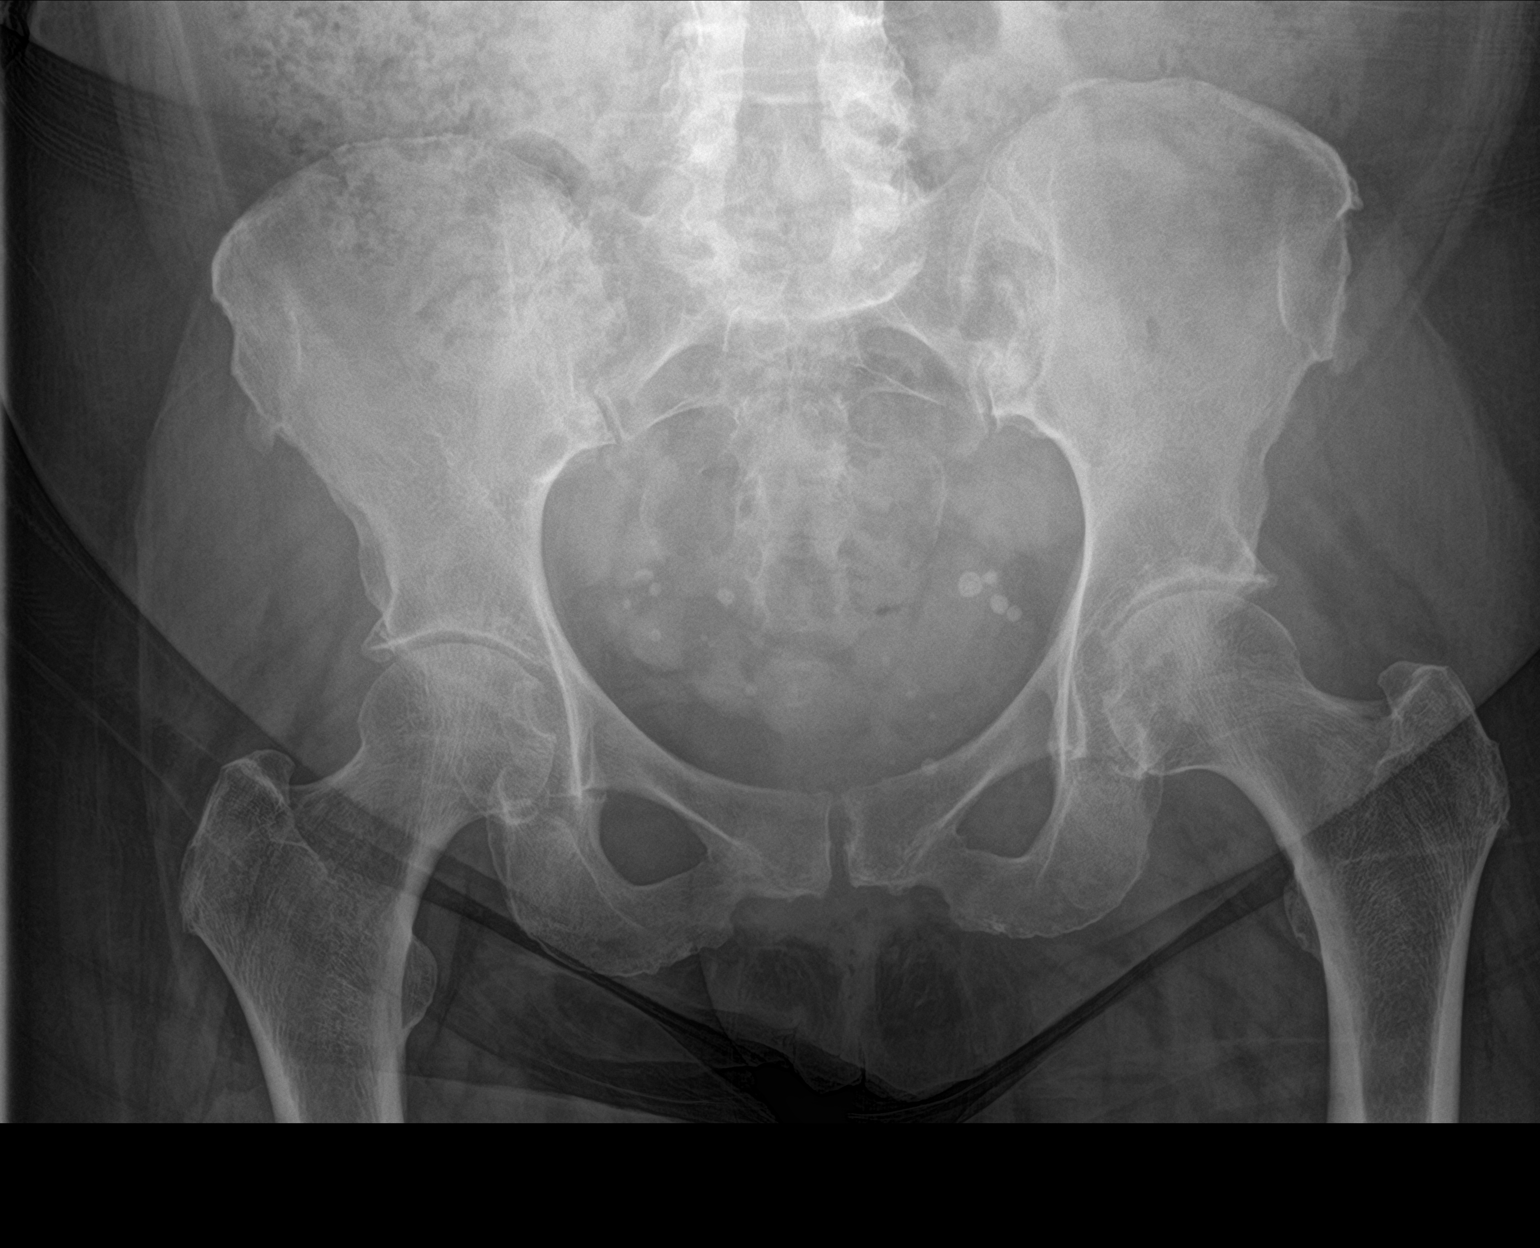

[hip ap]
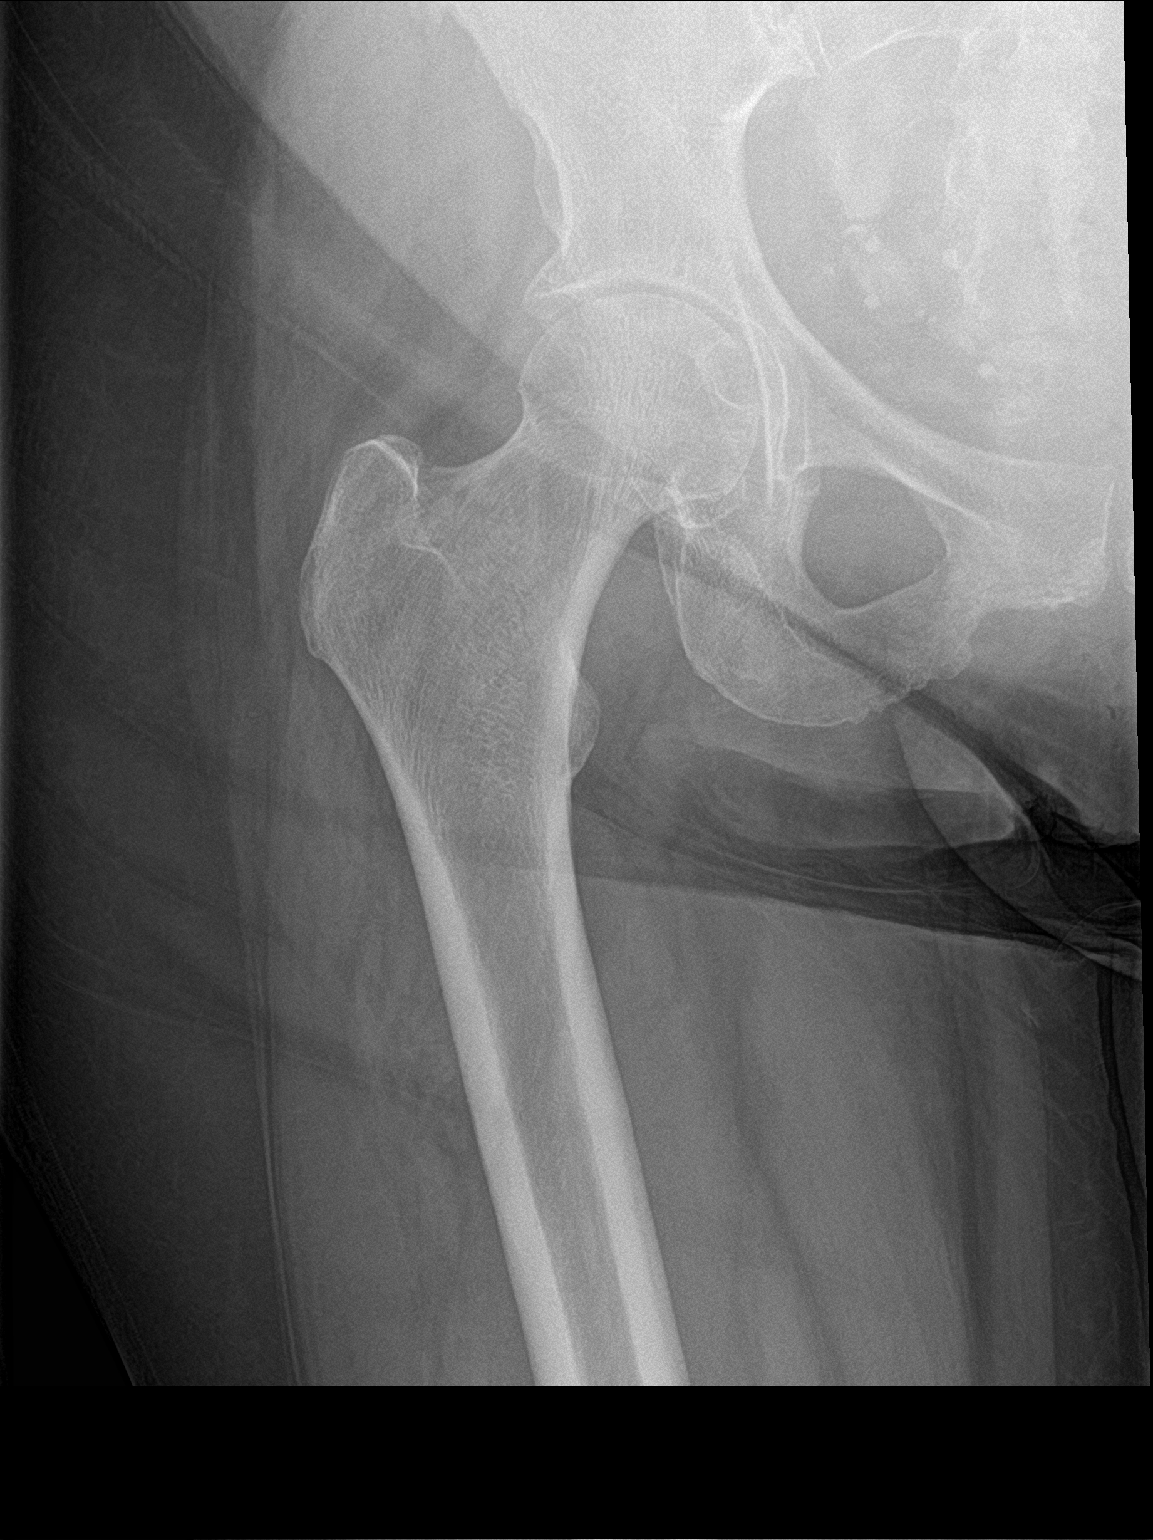

[hip lat]
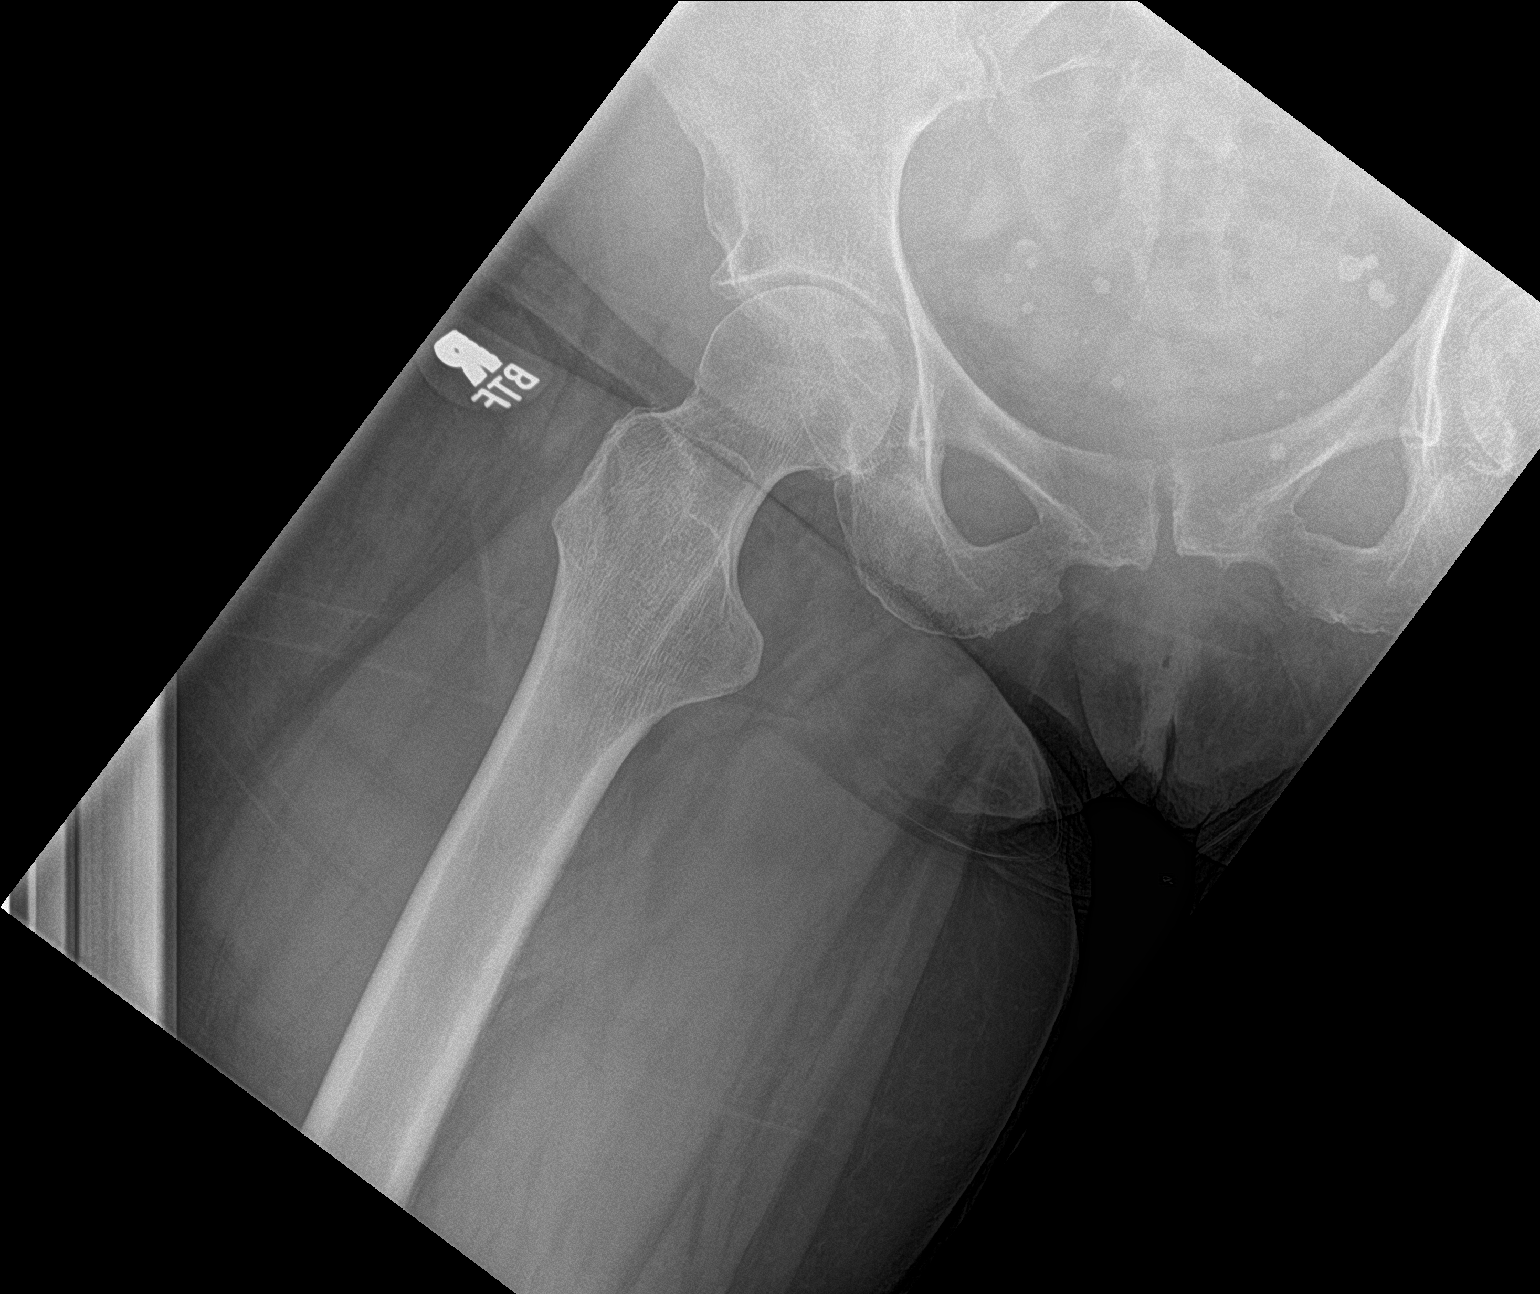

[3 of 3 positions shown; findings below may reference images not displayed]

FINDINGS: AP view of the pelvis and AP/frog leg views of the right hip. No
acute fracture or dislocation. Joint spaces maintained. Sacroiliac
joints are symmetric.
IMPRESSION: No acute osseous abnormality.

## 2022-11-22 ENCOUNTER — Ambulatory Visit
Admission: RE | Admit: 2022-11-22 | Discharge: 2022-11-22 | Disposition: A | Discharge status: Home / Self Care | Payer: Medicare Other | Source: Ambulatory Visit | Attending: Physical Medicine & Rehabilitation | Admitting: Physical Medicine & Rehabilitation

## 2022-11-22 DIAGNOSIS — M5416 Radiculopathy, lumbar region: Secondary | ICD-10-CM

## 2022-12-11 ENCOUNTER — Telehealth (HOSPITAL_COMMUNITY): Payer: Self-pay | Admitting: Student-PharmD

## 2022-12-11 NOTE — Telephone Encounter (Signed)
Patient called and explained that her Xarelto has increased in price from $7/month to $137/month and she can't afford it anymore. She has 3 or 4 tablets remaining. Sounds like she is in the donut hole and will be for the remainder of the year. Scheduled her for a visit tomorrow. Will try to help find a solution to ensure she does not run out of anticoagulation.

## 2022-12-12 ENCOUNTER — Ambulatory Visit (HOSPITAL_COMMUNITY)
Admission: RE | Admit: 2022-12-12 | Discharge: 2022-12-12 | Disposition: A | Discharge status: Home / Self Care | Payer: Medicare Other | Source: Ambulatory Visit | Attending: Vascular Surgery | Admitting: Vascular Surgery

## 2022-12-12 ENCOUNTER — Other Ambulatory Visit (HOSPITAL_COMMUNITY): Payer: Self-pay

## 2022-12-12 DIAGNOSIS — I82432 Acute embolism and thrombosis of left popliteal vein: Secondary | ICD-10-CM | POA: Diagnosis present

## 2022-12-12 MED ORDER — APIXABAN 5 MG PO TABS
5.0000 mg | ORAL_TABLET | Freq: Two times a day (BID) | ORAL | 0 refills | Status: DC
  Filled 2022-12-12: qty 60, 30d supply, fill #0

## 2022-12-12 NOTE — Patient Instructions (Addendum)
-   Finish up your current supply of Xarelto. Then start taking Eliquis 5 mg twice daily. - We will plan to refill Eliquis during your next appointment in January. -It is important to take your medication around the same time every day.  -Avoid NSAIDs like ibuprofen (Advil, Motrin) and naproxen (Aleve) as well as aspirin doses over 100 mg daily. -Tylenol (acetaminophen) is the preferred over the counter pain medication to lower the risk of bleeding. -Be sure to alert all of your health care providers that you are taking an anticoagulant prior to starting a new medication or having a procedure. -Monitor for signs and symptoms of bleeding (abnormal bruising, prolonged bleeding, nose bleeds, bleeding from gums, discolored urine, black tarry stools). If you have fallen and hit your head OR if your bleeding is severe or not stopping, seek emergency care.  -Go to the emergency room if emergent signs and symptoms of new clot occur (new or worse swelling and pain in an arm or leg, shortness of breath, chest pain, fast or irregular heartbeats, lightheadedness, dizziness, fainting, coughing up blood) or if you experience a significant color change (pale or blue) in the extremity that has the DVT.  -We recommend you wear compression stockings (20-30 mmHg) as long as you are having swelling or pain. Be sure to purchase the correct size and take them off at night.   Your next visit is on January 7th at 9:00 AM.  [facility] [address] [place], [place], [place] [address] Enter the hospital through Entrance C off [facility] and pull up to the [facility] entrance to the free valet parking.  Check in for your appointment at the [facility].   If you have any questions or need to reschedule an appointment, please call [phone] [facility].  If you are having an emergency, call 911 or present to the nearest emergency room.   What is a DVT?  -Deep vein thrombosis  (DVT) is a condition in which a blood clot forms in a vein of the deep venous system which can occur in the lower leg, thigh, pelvis, arm, or neck. This condition is serious and can be life-threatening if the clot travels to the arteries of the lungs and causing a blockage (pulmonary embolism, PE). A DVT can also damage veins in the leg, which can lead to long-term venous disease, leg pain, swelling, discoloration, and ulcers or sores (post-thrombotic syndrome).  -Treatment may include taking an anticoagulant medication to prevent more clots from forming and the current clot from growing, wearing compression stockings, and/or surgical procedures to remove or dissolve the clot.

## 2022-12-12 NOTE — Progress Notes (Addendum)
DVT Clinic Note  Name: Amanda Peterson     MRN: [id number]     DOB: 1955-05-29     Sex: female  PCP: Karenann Cai, NP  Today's Visit: Visit Information: Follow Up Visit  Referred to DVT Clinic by:  Delice Bison, PA-C  - [facility] Referred to CPP by: Dr. Chestine Spore Reason for referral:  Chief Complaint  Patient presents with   Med Management - DVT   HISTORY OF PRESENT ILLNESS: Amanda Peterson is a 67 y.o. female with PMH DVT (2016, 2021, 2024), PE (2021), post-thrombotic syndrome, breast cancer (in remission), anxiety, chronic neck and back pain  who presents for follow up medication management after diagnosis of DVT on 04/02/22. DVT in 2016 was s/p knee surgery. In 2021, she was found to have a LLE DVT and PE s/p COVID infection. Treatment was complicated by a GI bleed. IVC filter was placed and later retrieved. GI bleed resolved on its own with no known cause. She also reported that her oncologist found a lab marker making her more susceptible to clotting but she doesn't know what it was called. Anticoagulation was resumed by oncology at that time to reduce future VTE risk, but the patient ultimately discontinued Xarelto due to cost (was $450/month). She was then found to have a new DVT involving her L popliteal vein on 04/02/22. She was restarted on Xarelto, and the cost has come down to now be affordable for the patient.  Last seen in DVT Clinic 07/10/22 at which time she was tolerating Xarelto well and swelling had returned to her baseline. She was having significant nerve pain, but was being worked up by her PCP for PVD. Pain did not appear to be from DVT. She was discharged from DVT Clinic, but encouraged to reach out if she experienced cost issues with Xarelto to ensure she does not self-discontinue again in the future. On 12/11/22 patient called reporting the price of Xarelto had increased from $47/month to $137/month which is not affordable for her. She is likely in the donut hole and will  be for the rest of the year. She did have a few tablets of Xarelto remaining so she was scheduled in the DVT Clinic the next day to find a solution to ensure she does not run out of medication.  Today patient reports continued pain in her left leg and swelling is at her baseline bilaterally. Denies abnormal bleeding or bruising. Denies missed doses of Xarelto. Brought her Xarelto bottle today and has 5 tablets left. Endorses wearing compression stockings. Reports shooting pain in her left leg that starts in her buttocks and goes down to her foot. Symptoms are particularly worse at night with cramping. Has been evaluated by PCP for this. Patient reports PCP thought this was related to sciatica and referred her to a spine specialist who attributed it to her lower back nerves.  Positive Thrombotic Risk Factors: Previous VTE, Known thrombophilic condition, Older Age Bleeding Risk Factors: Age >65 years, Anticoagulant therapy, Anemia  Negative Thrombotic Risk Factors: Recent surgery (within 3 months), Recent trauma (within 3 months), Recent admission to hospital with acute illness (within 3 months), Paralysis, paresis, or recent plaster cast immobilization of lower extremity, Bed rest >72 hours within 3 months, Central venous catheterization, Sedentary journey lasting >8 hours within 4 weeks, Pregnancy, Within 6 weeks postpartum, Testosterone therapy, Estrogen therapy, Recent cesarean section (within 3 months), Erythropoiesis-stimulating agent, Recent COVID diagnosis (within 3 months), Active cancer, Smoking, Non-malignant, chronic inflammatory condition, Obesity  Rx Insurance Coverage: Medicare Rx Affordability: Xarelto copay previously $47/month which was affordable, however she is now in the donut hole and Xarelto is $137/month which is not affordable for her. Eliquis filled at [facility] pharmacy today using one-time free trial card and provided her with medication samples to last until early 2025. Rx Assistance  Provided: Free trial card Preferred Pharmacy: Will discuss plan for refills at follow up appointment  Past Medical History:  Diagnosis Date   Anxiety    Breast cancer (HCC)    Chronic back pain    Chronic neck pain    Depression    High cholesterol    History of suicidal ideation    Hypertension    Lumbar radiculopathy     Past Surgical History:  Procedure Laterality Date   BACK SURGERY     BREAST LUMPECTOMY     CERVICAL DISC SURGERY     SHOULDER SURGERY     THYROID SURGERY Right     Social History   Socioeconomic History   Marital status: Married    Spouse name: Not on file   Number of children: Not on file   Years of education: Not on file   Highest education level: Not on file  Occupational History   Not on file  Tobacco Use   Smoking status: Never   Smokeless tobacco: Never  Vaping Use   Vaping status: Never Used  Substance and Sexual Activity   Alcohol use: No   Drug use: No   Sexual activity: Not on file  Other Topics Concern   Not on file  Social History Narrative   Not on file   Social Determinants of Health   Financial Resource Strain: Not on file  Food Insecurity: Not on file  Transportation Needs: Not on file  Physical Activity: Not on file  Stress: Not on file  Social Connections: Not on file  Intimate Partner Violence: Not on file    No family history on file.  Allergies as of 12/12/2022 - Review Complete 12/12/2022  Allergen Reaction Noted   Hydrocodone Anaphylaxis 07/22/2021   Tramadol Anaphylaxis 07/22/2021   Oxycodone Other (See Comments) 11/21/2012   Codeine Rash 11/21/2012   Darvocet [propoxyphene n-acetaminophen] Rash 11/21/2012    Current Outpatient Medications on File Prior to Encounter  Medication Sig Dispense Refill   acetaminophen (TYLENOL) 500 MG tablet Take 1,000 mg by mouth every 8 (eight) hours as needed for mild pain or moderate pain.     ALPRAZolam (XANAX) 1 MG tablet Take 1 mg by mouth at bedtime as needed for sleep.      bisacodyl (BISACODYL) 5 MG EC tablet Take 5 mg by mouth daily as needed for moderate constipation.     ciprofloxacin (CIPRO) 500 MG tablet Take 500 mg by mouth 2 (two) times daily.     DULoxetine (CYMBALTA) 60 MG capsule Take 60 mg by mouth daily.      ezetimibe (ZETIA) 10 MG tablet Take 10 mg by mouth daily.     fluticasone (FLONASE) 50 MCG/ACT nasal spray Place 1 spray into both nostrils daily.     furosemide (LASIX) 40 MG tablet Take 0.5 tablets (20 mg total) by mouth daily. 30 tablet 1   gabapentin (NEURONTIN) 300 MG capsule Take 300 mg by mouth daily. Takes in the evening in addition to 600 mg dose     gabapentin (NEURONTIN) 600 MG tablet Take 600 mg by mouth 3 (three) times daily.     glipiZIDE (GLUCOTROL XL) 2.5  MG 24 hr tablet Take 2.5 mg by mouth daily with breakfast.     hydrochlorothiazide (HYDRODIURIL) 12.5 MG tablet Take 12.5 mg by mouth daily.     levocetirizine (XYZAL) 5 MG tablet Take 5 mg by mouth daily.     lubiprostone (AMITIZA) 24 MCG capsule Take 24 mcg by mouth 2 (two) times daily.     metoprolol succinate (TOPROL-XL) 50 MG 24 hr tablet Take 50 mg by mouth every morning. Take with or immediately following a meal.     rOPINIRole (REQUIP) 0.25 MG tablet Take 0.25 mg by mouth at bedtime.     sucralfate (CARAFATE) 1 g tablet Take 1 g by mouth 4 (four) times daily.     zolpidem (AMBIEN) 5 MG tablet Take 10 mg by mouth at bedtime as needed for sleep.     No current facility-administered medications on file prior to encounter.   REVIEW OF SYSTEMS:  Review of Systems  Respiratory:  Negative for shortness of breath.   Cardiovascular:  Positive for leg swelling. Negative for chest pain and palpitations.  Musculoskeletal:  Positive for myalgias.  Neurological:  Positive for tingling. Negative for dizziness.   PHYSICAL EXAMINATION:  Physical Exam Cardiovascular:     Rate and Rhythm: Normal rate.  Pulmonary:     Effort: Pulmonary effort is normal.  Musculoskeletal:         General: Swelling (1+ bilaterally) and tenderness present.  Skin:    Findings: No bruising.  Psychiatric:        Mood and Affect: Mood normal.   Villalta Score for Post-Thrombotic Syndrome: Pain: Moderate Cramps: Severe Heaviness: Moderate Paresthesia: Moderate Pruritus: Mild Pretibial Edema: Mild Skin Induration: Absent Hyperpigmentation: Absent Redness: Absent Venous Ectasia: Absent Pain on calf compression: Absent Villalta Preliminary Score: 11 Is venous ulcer present?: No If venous ulcer is present and score is <15, then 15 points total are assigned: Absent Villalta Total Score: 11  LABS:  CBC     Component Value Date/Time   WBC 5.3 08/19/2022 1554   RBC 4.11 08/19/2022 1554   HGB 11.2 (L) 08/19/2022 1554   HCT 36.2 08/19/2022 1554   PLT 344 08/19/2022 1554   MCV 88.1 08/19/2022 1554   MCH 27.3 08/19/2022 1554   MCHC 30.9 08/19/2022 1554   RDW 14.4 08/19/2022 1554   LYMPHSABS 2.1 11/08/2021 1744   MONOABS 0.4 11/08/2021 1744   EOSABS 0.3 11/08/2021 1744   BASOSABS 0.0 11/08/2021 1744    Hepatic Function      Component Value Date/Time   PROT 7.2 11/08/2021 1744   ALBUMIN 3.8 11/08/2021 1744   AST 16 11/08/2021 1744   ALT 8 11/08/2021 1744   ALKPHOS 79 11/08/2021 1744   BILITOT 0.3 11/08/2021 1744    Renal Function   Lab Results  Component Value Date   CREATININE 1.00 08/19/2022   CREATININE 1.08 (H) 08/10/2022   CREATININE 0.89 04/02/2022    CrCl cannot be calculated (Patient's most recent lab result is older than the maximum 21 days allowed.).   [facility] Vascular Lab Studies:  04/02/22 venous doppler IMPRESSION: 1. Occlusive deep venous thrombosis of the left popliteal vein. The remaining left lower extremity venous structures are unremarkable. 2. No evidence of right lower extremity DVT.  07/02/22 venous doppler  IMPRESSION: No evidence of deep venous thrombosis in either lower extremity. Interval resolution of left popliteal  DVT.  ASSESSMENT: Location of DVT: Left popliteal vein Cause of DVT: unprovoked  Patient with history of multiple provoked DVTs in  the past. She reports a history of thrombophilia found by oncology with the intention of continuing Xarelto indefinitely, but the patient has self-discontinued in the past due to cost. This is what led to her most recent unprovoked DVT 04/02/22. Plan is for indefinite anticoagulation, as long as benefits outweigh risks. She has seen oncology who is agreement with this plan. Edema today appears to be at her baseline bilaterally. Patient has previously been diagnosed with post-thrombotic syndrome from her previous two DVTs. She is still having severe nerve pain in her left leg that she has seen her PCP and a spine specialist for. Pain related to the DVT appears improved from diagnosis. Her PCP ordered a follow up ultrasound on 07/02/22 which showed resolution of the previously noted thrombus.   Patient has been able to afford Xarelto and reports adherence, however she is now in the donut hole and Xarelto is unaffordable ($137/month). Discussed that the donut hole will no longer exist in 2025 so once we get through the end of 2024 this should not be an issue anymore. Patient also reported discussing her medications with a Medicare specialist and confirming that co-pays would be affordable for next year. We discussed switching to Eliquis so that she is able to get 1 month of medication for free with the free trial card and then bridge her with medication samples until the new year. She has already used the Xarelto free trial card and we do not have samples of Xarelto so this required switching medications to ensure she is able to continue on anticoagulation. She will complete her last 5 tablets of Xarelto 20 mg then start Eliquis 5 mg BID. Patient was in agreement with this plan. We have scheduled follow up with her in early January to ensure she has access to medication in the new year  and will make a plan for refills at that time.  PLAN: -Complete remaining supply of Xarelto then discontinue. -After completing current supply of Xarelto, start apixaban (Eliquis) 5 mg twice daily. -Expected duration of therapy: Indefinite. Therapy started on 12/12/22. -Patient educated on purpose, proper use and potential adverse effects of apixaban (Eliquis). -Discussed importance of taking medication around the same time every day. -Advised patient of medications to avoid (NSAIDs, aspirin doses >100 mg daily). -Educated that Tylenol (acetaminophen) is the preferred analgesic to lower the risk of bleeding. -Advised patient to alert all providers of anticoagulation therapy prior to starting a new medication or having a procedure. -Emphasized importance of monitoring for signs and symptoms of bleeding (abnormal bruising, prolonged bleeding, nose bleeds, bleeding from gums, discolored urine, black tarry stools). -Educated patient to present to the ED if emergent signs and symptoms of new thrombosis occur.  Follow up: DVT Clinic in 2 months  Jarrett Ables, PharmD PGY-1 Pharmacy Resident  Pervis Hocking, PharmD, BCACP, CPP Deep [facility]osis [facility] Clinical Pharmacist Practitioner Office: [phone]

## 2022-12-17 ENCOUNTER — Telehealth (HOSPITAL_COMMUNITY): Payer: Self-pay | Admitting: Student-PharmD

## 2022-12-17 NOTE — Telephone Encounter (Signed)
Patient called to share that she was seen by her pain doctor and they want to do a procedure to help with her back pain (possible a spinal injection? patient is not sure) and they need to talk with someone about her medicines. I imagine she means they need clearance instructions for what to do with her Eliquis. I provided her with our fax number she can give the office if they need to request anticoagulation clearance.

## 2022-12-18 ENCOUNTER — Other Ambulatory Visit: Payer: Self-pay | Admitting: Nurse Practitioner

## 2022-12-18 DIAGNOSIS — Z0000 Encounter for general adult medical examination without abnormal findings: Secondary | ICD-10-CM

## 2022-12-24 ENCOUNTER — Telehealth (HOSPITAL_COMMUNITY): Payer: Self-pay | Admitting: Student-PharmD

## 2022-12-24 MED ORDER — RIVAROXABAN 20 MG PO TABS: 20.0000 mg | ORAL_TABLET | Freq: Every day | ORAL | 1 refills | Status: DC

## 2022-12-24 NOTE — Telephone Encounter (Signed)
Patient called stating that she has been having full body itching as well as dizziness for the last couple weeks. She hasn't been able to drive because of the dizziness. The symptoms are worse after each dose. She realized that it started when she switched from Xarelto to Eliquis. The reason we switched her to Eliquis was for medication access (see visit note from 12/22/22). In light of the onset of these symptoms occurring shortly after switching to Eliquis and worsening of symptoms after each dose, I would like to switch her back to Xarelto which she tolerated well. Explained that she has already used her free card for Xarelto and that I no longer have any more samples to provide to her, which means she would need to pay ~$140 for the 1 month supply to carry her over into the new year. She is agreeable to doing this in order to stop Eliquis. Prescription for Xarelto sent to her preferred pharmacy. Advised her to reach out to her PCP if symptoms do not improve after stopping Eliquis, as there may be something else contributing to these symptoms. I have a follow up with her in January and will re-evaluate at that time. She also tells me that she will be getting a spinal injection and says that the office will be reaching out to her PCP for clearance.

## 2022-12-31 ENCOUNTER — Ambulatory Visit
Admission: RE | Admit: 2022-12-31 | Discharge: 2022-12-31 | Disposition: A | Discharge status: Home / Self Care | Payer: Medicare Other | Source: Ambulatory Visit | Attending: Nurse Practitioner | Admitting: Nurse Practitioner

## 2022-12-31 ENCOUNTER — Other Ambulatory Visit: Payer: Self-pay | Admitting: Nurse Practitioner

## 2022-12-31 DIAGNOSIS — R911 Solitary pulmonary nodule: Secondary | ICD-10-CM

## 2023-01-01 ENCOUNTER — Emergency Department (HOSPITAL_COMMUNITY): Payer: Medicare Other

## 2023-01-01 ENCOUNTER — Encounter (HOSPITAL_COMMUNITY): Payer: Self-pay | Admitting: *Deleted

## 2023-01-01 ENCOUNTER — Other Ambulatory Visit: Payer: Self-pay

## 2023-01-01 ENCOUNTER — Observation Stay (HOSPITAL_COMMUNITY)
Admission: EM | Admit: 2023-01-01 | Discharge: 2023-01-02 | Disposition: A | Discharge status: Home / Self Care | Payer: Medicare Other | Attending: Internal Medicine | Admitting: Internal Medicine

## 2023-01-01 DIAGNOSIS — E669 Obesity, unspecified: Secondary | ICD-10-CM | POA: Diagnosis not present

## 2023-01-01 DIAGNOSIS — R42 Dizziness and giddiness: Secondary | ICD-10-CM

## 2023-01-01 DIAGNOSIS — E1140 Type 2 diabetes mellitus with diabetic neuropathy, unspecified: Secondary | ICD-10-CM | POA: Diagnosis not present

## 2023-01-01 DIAGNOSIS — E860 Dehydration: Secondary | ICD-10-CM | POA: Diagnosis not present

## 2023-01-01 DIAGNOSIS — Z853 Personal history of malignant neoplasm of breast: Secondary | ICD-10-CM | POA: Diagnosis not present

## 2023-01-01 DIAGNOSIS — Z86718 Personal history of other venous thrombosis and embolism: Secondary | ICD-10-CM | POA: Insufficient documentation

## 2023-01-01 DIAGNOSIS — R9431 Abnormal electrocardiogram [ECG] [EKG]: Secondary | ICD-10-CM | POA: Insufficient documentation

## 2023-01-01 DIAGNOSIS — M792 Neuralgia and neuritis, unspecified: Secondary | ICD-10-CM | POA: Diagnosis not present

## 2023-01-01 DIAGNOSIS — R627 Adult failure to thrive: Secondary | ICD-10-CM | POA: Diagnosis not present

## 2023-01-01 DIAGNOSIS — E876 Hypokalemia: Secondary | ICD-10-CM | POA: Diagnosis not present

## 2023-01-01 DIAGNOSIS — I10 Essential (primary) hypertension: Secondary | ICD-10-CM | POA: Diagnosis not present

## 2023-01-01 DIAGNOSIS — R531 Weakness: Secondary | ICD-10-CM

## 2023-01-01 DIAGNOSIS — Z7984 Long term (current) use of oral hypoglycemic drugs: Secondary | ICD-10-CM | POA: Insufficient documentation

## 2023-01-01 DIAGNOSIS — Z1152 Encounter for screening for COVID-19: Secondary | ICD-10-CM | POA: Diagnosis not present

## 2023-01-01 DIAGNOSIS — Z79899 Other long term (current) drug therapy: Secondary | ICD-10-CM | POA: Diagnosis not present

## 2023-01-01 DIAGNOSIS — N179 Acute kidney failure, unspecified: Secondary | ICD-10-CM | POA: Diagnosis not present

## 2023-01-01 DIAGNOSIS — Z6830 Body mass index (BMI) 30.0-30.9, adult: Secondary | ICD-10-CM | POA: Insufficient documentation

## 2023-01-01 DIAGNOSIS — Z7901 Long term (current) use of anticoagulants: Secondary | ICD-10-CM | POA: Diagnosis not present

## 2023-01-01 DIAGNOSIS — I82409 Acute embolism and thrombosis of unspecified deep veins of unspecified lower extremity: Secondary | ICD-10-CM | POA: Insufficient documentation

## 2023-01-01 LAB — COMPREHENSIVE METABOLIC PANEL
ALT: 14 U/L (ref 0–44)
AST: 17 U/L (ref 15–41)
Albumin: 3.8 g/dL (ref 3.5–5.0)
Alkaline Phosphatase: 96 U/L (ref 38–126)
Anion gap: 10 (ref 5–15)
BUN: 17 mg/dL (ref 8–23)
CO2: 34 mmol/L — ABNORMAL HIGH (ref 22–32)
Calcium: 9.4 mg/dL (ref 8.9–10.3)
Chloride: 91 mmol/L — ABNORMAL LOW (ref 98–111)
Creatinine, Ser: 1.22 mg/dL — ABNORMAL HIGH (ref 0.44–1.00)
GFR, Estimated: 48 mL/min — ABNORMAL LOW (ref >60)
Glucose, Bld: 115 mg/dL — ABNORMAL HIGH (ref 70–99)
Potassium: 2.4 mmol/L — CL (ref 3.5–5.1)
Sodium: 135 mmol/L (ref 135–145)
Total Bilirubin: 0.3 mg/dL (ref <1.2)
Total Protein: 7.7 g/dL (ref 6.5–8.1)

## 2023-01-01 LAB — CBC WITH DIFFERENTIAL/PLATELET
Abs Immature Granulocytes: 0.01 10*3/uL (ref 0.00–0.07)
Basophils Absolute: 0.1 10*3/uL (ref 0.0–0.1)
Basophils Relative: 1 %
Eosinophils Absolute: 0.3 10*3/uL (ref 0.0–0.5)
Eosinophils Relative: 5 %
HCT: 39.8 % (ref 36.0–46.0)
Hemoglobin: 12.4 g/dL (ref 12.0–15.0)
Immature Granulocytes: 0 %
Lymphocytes Relative: 36 %
Lymphs Abs: 1.9 10*3/uL (ref 0.7–4.0)
MCH: 27 pg (ref 26.0–34.0)
MCHC: 31.2 g/dL (ref 30.0–36.0)
MCV: 86.7 fL (ref 80.0–100.0)
Monocytes Absolute: 0.4 10*3/uL (ref 0.1–1.0)
Monocytes Relative: 7 %
Neutro Abs: 2.7 10*3/uL (ref 1.7–7.7)
Neutrophils Relative %: 51 %
Platelets: 389 10*3/uL (ref 150–400)
RBC: 4.59 MIL/uL (ref 3.87–5.11)
RDW: 13.3 % (ref 11.5–15.5)
WBC: 5.3 10*3/uL (ref 4.0–10.5)
nRBC: 0 % (ref 0.0–0.2)

## 2023-01-01 LAB — HEMOGLOBIN A1C
Hgb A1c MFr Bld: 6.2 % — ABNORMAL HIGH (ref 4.8–5.6)
Mean Plasma Glucose: 131.24 mg/dL

## 2023-01-01 LAB — URINALYSIS, ROUTINE W REFLEX MICROSCOPIC
Bilirubin Urine: NEGATIVE
Glucose, UA: NEGATIVE mg/dL
Hgb urine dipstick: NEGATIVE
Ketones, ur: NEGATIVE mg/dL
Leukocytes,Ua: NEGATIVE
Nitrite: NEGATIVE
Protein, ur: NEGATIVE mg/dL
Specific Gravity, Urine: 1.008 (ref 1.005–1.030)
pH: 7 (ref 5.0–8.0)

## 2023-01-01 LAB — MAGNESIUM: Magnesium: 2.3 mg/dL (ref 1.7–2.4)

## 2023-01-01 LAB — GLUCOSE, CAPILLARY
Glucose-Capillary: 65 mg/dL — ABNORMAL LOW (ref 70–99)
Glucose-Capillary: 66 mg/dL — ABNORMAL LOW (ref 70–99)
Glucose-Capillary: 106 mg/dL — ABNORMAL HIGH (ref 70–99)

## 2023-01-01 LAB — BASIC METABOLIC PANEL
Anion gap: 8 (ref 5–15)
BUN: 14 mg/dL (ref 8–23)
CO2: 31 mmol/L (ref 22–32)
Calcium: 9.1 mg/dL (ref 8.9–10.3)
Chloride: 98 mmol/L (ref 98–111)
Creatinine, Ser: 0.95 mg/dL (ref 0.44–1.00)
GFR, Estimated: >60 mL/min (ref >60)
Glucose, Bld: 123 mg/dL — ABNORMAL HIGH (ref 70–99)
Potassium: 3.3 mmol/L — ABNORMAL LOW (ref 3.5–5.1)
Sodium: 137 mmol/L (ref 135–145)

## 2023-01-01 LAB — RESP PANEL BY RT-PCR (RSV, FLU A&B, COVID)  RVPGX2
Influenza A by PCR: NEGATIVE
Influenza B by PCR: NEGATIVE
Resp Syncytial Virus by PCR: NEGATIVE
SARS Coronavirus 2 by RT PCR: NEGATIVE

## 2023-01-01 LAB — LIPASE, BLOOD: Lipase: 38 U/L (ref 11–51)

## 2023-01-01 MED ORDER — FLUTICASONE PROPIONATE 50 MCG/ACT NA SUSP
1.0000 | Freq: Every day | NASAL | Status: DC
  Administered 2023-01-02: 1 via NASAL
  Filled 2023-01-01: qty 16

## 2023-01-01 MED ORDER — POTASSIUM CHLORIDE 10 MEQ/100ML IV SOLN
10.0000 meq | INTRAVENOUS | Status: AC
  Administered 2023-01-01 (×4): 10 meq via INTRAVENOUS
  Filled 2023-01-01 (×4): qty 100

## 2023-01-01 MED ORDER — BISACODYL 5 MG PO TBEC
10.0000 mg | DELAYED_RELEASE_TABLET | Freq: Every day | ORAL | Status: DC | PRN
  Administered 2023-01-01: 10 mg via ORAL
  Filled 2023-01-01: qty 2

## 2023-01-01 MED ORDER — SUCRALFATE 1 G PO TABS
1.0000 g | ORAL_TABLET | Freq: Four times a day (QID) | ORAL | Status: DC
  Administered 2023-01-01 – 2023-01-02 (×3): 1 g via ORAL
  Filled 2023-01-01 (×3): qty 1

## 2023-01-01 MED ORDER — RIVAROXABAN 20 MG PO TABS
20.0000 mg | ORAL_TABLET | Freq: Every day | ORAL | Status: DC
  Administered 2023-01-01: 20 mg via ORAL
  Filled 2023-01-01: qty 1

## 2023-01-01 MED ORDER — POTASSIUM CHLORIDE CRYS ER 20 MEQ PO TBCR
20.0000 meq | EXTENDED_RELEASE_TABLET | Freq: Once | ORAL | Status: AC
  Administered 2023-01-01: 20 meq via ORAL
  Filled 2023-01-01: qty 1

## 2023-01-01 MED ORDER — POTASSIUM CHLORIDE IN NACL 40-0.9 MEQ/L-% IV SOLN
INTRAVENOUS | Status: DC
  Filled 2023-01-01: qty 1000

## 2023-01-01 MED ORDER — EZETIMIBE 10 MG PO TABS
10.0000 mg | ORAL_TABLET | Freq: Every day | ORAL | Status: DC
  Administered 2023-01-02: 10 mg via ORAL
  Filled 2023-01-01: qty 1

## 2023-01-01 MED ORDER — POTASSIUM CHLORIDE CRYS ER 20 MEQ PO TBCR
40.0000 meq | EXTENDED_RELEASE_TABLET | Freq: Once | ORAL | Status: AC
  Administered 2023-01-01: 40 meq via ORAL
  Filled 2023-01-01: qty 2

## 2023-01-01 MED ORDER — LORAZEPAM 0.5 MG PO TABS: 0.5000 mg | ORAL_TABLET | Freq: Three times a day (TID) | ORAL | Status: DC | PRN

## 2023-01-01 MED ORDER — LORATADINE 10 MG PO TABS
10.0000 mg | ORAL_TABLET | Freq: Every day | ORAL | Status: DC
  Administered 2023-01-02: 10 mg via ORAL
  Filled 2023-01-01: qty 1

## 2023-01-01 MED ORDER — POTASSIUM CHLORIDE 20 MEQ PO PACK
40.0000 meq | PACK | Freq: Once | ORAL | Status: AC
  Administered 2023-01-01: 40 meq via ORAL
  Filled 2023-01-01: qty 2

## 2023-01-01 MED ORDER — ALPRAZOLAM 0.5 MG PO TABS: 1.0000 mg | ORAL_TABLET | Freq: Every evening | ORAL | Status: DC | PRN

## 2023-01-01 MED ORDER — LEVOCETIRIZINE DIHYDROCHLORIDE 5 MG PO TABS: 5.0000 mg | ORAL_TABLET | Freq: Every day | ORAL | Status: DC

## 2023-01-01 MED ORDER — GABAPENTIN 300 MG PO CAPS
600.0000 mg | ORAL_CAPSULE | Freq: Three times a day (TID) | ORAL | Status: DC
  Administered 2023-01-01 – 2023-01-02 (×2): 600 mg via ORAL
  Filled 2023-01-01 (×2): qty 2

## 2023-01-01 MED ORDER — METOPROLOL SUCCINATE ER 50 MG PO TB24
50.0000 mg | ORAL_TABLET | Freq: Every morning | ORAL | Status: DC
  Administered 2023-01-02: 50 mg via ORAL
  Filled 2023-01-01: qty 1

## 2023-01-01 MED ORDER — HYDRALAZINE HCL 20 MG/ML IJ SOLN: 5.0000 mg | INTRAMUSCULAR | Status: DC | PRN

## 2023-01-01 MED ORDER — ZOLPIDEM TARTRATE 5 MG PO TABS
5.0000 mg | ORAL_TABLET | Freq: Every day | ORAL | Status: DC
  Administered 2023-01-01: 5 mg via ORAL
  Filled 2023-01-01: qty 1

## 2023-01-01 MED ORDER — SODIUM CHLORIDE 0.9 % IV BOLUS
500.0000 mL | Freq: Once | INTRAVENOUS | Status: AC
  Administered 2023-01-01: 500 mL via INTRAVENOUS

## 2023-01-01 NOTE — ED Provider Triage Note (Signed)
Emergency Medicine Provider Triage Evaluation Note  Amanda Peterson , a 67 y.o. female  was evaluated in triage.  Pt complains of generalized weakness and fatigue.  Symptoms have been present for 2 weeks.  She describes having nausea, decreased appetite, dizziness with sensation of room spinning and itching of the palms of her hands.  Symptoms began after receiving COVID and flu vaccines.  Symptoms have been associated with diffuse frontal headache.  She endorses nausea but no vomiting or diarrhea.  No chest pain or shortness of breath.  States she has a history of diabetes and her spouse has been checking her blood sugars, reports her sugars have been "low."  Review of Systems  Positive: Generalized weakness, dizziness, decreased appetite, nausea Negative: Vomiting, fever, chest pain, shortness of breath  Physical Exam  BP (!) 142/85   Pulse 66   Temp 98.1 F (36.7 C) (Oral)   Resp 19   Ht 5' 5.5" (1.664 m)   Wt 86.6 kg   SpO2 98%   BMI 31.30 kg/m  Gen:   Awake, no distress   Resp:  Normal effort  MSK:   Moves extremities without difficulty  Other:    Medical Decision Making  Medically screening exam initiated at 11:57 AM.  Appropriate orders placed.  Wynn Winders was informed that the remainder of the evaluation will be completed by another provider, this initial triage assessment does not replace that evaluation, and the importance of remaining in the ED until their evaluation is complete.     Pauline Aus, PA-C 01/01/23 1159

## 2023-01-01 NOTE — ED Triage Notes (Signed)
Pt states she took the covid and flu shot x 3 weeks ago and after taking them she feels weak, no appetite and states her hands feel "itchy"

## 2023-01-01 NOTE — H&P (Signed)
TRH H&P   Patient Demographics:    Amanda Peterson, is a 67 y.o. female  MRN: [id number]   DOB - 1955/07/26  Admit Date - 01/01/2023  Outpatient Primary MD for the patient is Karenann Cai, NP  Referring MD/NP/PA: PA small  Patient coming from: Home  Chief Complaint  Patient presents with   Weakness      HPI:    Amanda Peterson  is a 67 y.o. female, with past medical history of breast cancer, hypertension, diabetes, chronic back pain, radiculopathy, patient presents to ED secondary to complaints of generalized weakness, fatigue and poor oral intake, patient reports she took her COVID-vaccine and flu vaccine 3 weeks ago, but since she has been feeling very weak, fatigued, with very poor oral intake, report despite that she kept taking her blood pressure medicine including hydrochlorothiazide and Lasix, [place] any abdominal pain, chest pain, nausea, vomiting, diarrhea, reports some itching in her palms of the hands, and reports some headache, denies any vision changes, loss of consciousness or focal deficits. -In ED her EKG noted to have prolonged QTc at 512, she appears clinically dehydrated, with elevated creatinine at 1.22, with low potassium at 2.4, resume was within normal limit at 2.3, her UA was negative, her CO2 was 34, and blood glucose was 115, [facility] hospitalist consulted to admit.    Review of systems:    A full 10 point Review of Systems was done, except as stated above, all other Review of Systems were negative.   With Past History of the following :    Past Medical History:  Diagnosis Date   Anxiety    Breast cancer (HCC)    Chronic back pain    Chronic neck pain    Depression    High cholesterol    History of suicidal ideation    Hypertension    Lumbar radiculopathy       Past Surgical History:  Procedure Laterality Date   BACK SURGERY     BREAST  LUMPECTOMY     CERVICAL DISC SURGERY     SHOULDER SURGERY     THYROID SURGERY Right       Social History:     Social History   Tobacco Use   Smoking status: Never   Smokeless tobacco: Never  Substance Use Topics   Alcohol use: No        Family History :    History reviewed. No pertinent family history.    Home Medications:   Prior to Admission medications   Medication Sig Start Date End Date Taking? Authorizing Provider  acetaminophen (TYLENOL) 500 MG tablet Take 1,000 mg by mouth every 8 (eight) hours as needed for mild pain or moderate pain.   Yes [provider]  ALPRAZolam Prudy Feeler) 1 MG tablet Take 1 mg by mouth at bedtime as needed for sleep. 05/21/22  Yes [provider]  bisacodyl (BISACODYL) 5 MG EC tablet Take 10 mg by mouth daily as needed for moderate constipation.   Yes [provider]  DULoxetine (CYMBALTA) 60 MG capsule Take 60 mg by mouth daily.    Yes [provider]  ezetimibe (ZETIA) 10 MG tablet Take 10 mg by mouth daily. 05/16/20  Yes [provider]  fluticasone (FLONASE) 50 MCG/ACT nasal spray Place 1 spray into both nostrils daily. 08/27/22  Yes [provider]  furosemide (LASIX) 40 MG tablet Take 0.5 tablets (20 mg total) by mouth daily. 08/19/22  Yes Bethann Berkshire, MD  gabapentin (NEURONTIN) 300 MG capsule Take 300 mg by mouth daily. Takes in the evening in addition to 600 mg dose 07/02/22  Yes [provider]  gabapentin (NEURONTIN) 600 MG tablet Take 600 mg by mouth 3 (three) times daily. 06/18/21  Yes [provider]  hydrochlorothiazide (HYDRODIURIL) 25 MG tablet Take 25 mg by mouth daily.   Yes [provider]  levocetirizine (XYZAL) 5 MG tablet Take 5 mg by mouth daily.   Yes [provider]  LORazepam (ATIVAN) 0.5 MG tablet Take 0.5 mg by mouth every 8 (eight) hours as needed for anxiety. 11/19/22  Yes [provider]  metoprolol succinate (TOPROL-XL)  50 MG 24 hr tablet Take 50 mg by mouth every morning. Take with or immediately following a meal.   Yes [provider]  rivaroxaban (XARELTO) 20 MG TABS tablet Take 1 tablet (20 mg total) by mouth daily with supper. Take with food. 12/24/22  Yes Nada Libman, MD  sucralfate (CARAFATE) 1 g tablet Take 1 g by mouth 4 (four) times daily. 03/19/22  Yes [provider]  zolpidem (AMBIEN) 10 MG tablet Take 10 mg by mouth at bedtime.   Yes [provider]  glipiZIDE (GLUCOTROL) 5 MG tablet Take 5 mg by mouth daily before breakfast.    [provider]     Allergies:     Allergies  Allergen Reactions   Hydrocodone Anaphylaxis and Hives   Tramadol Anaphylaxis   Oxycodone Other (See Comments)    Burning to lips   Tramadol Hcl Hives   Codeine Rash and Hives   Darvocet [Propoxyphene N-Acetaminophen] Rash   Eliquis [Apixaban] Itching     Physical Exam:   Vitals  Blood pressure 119/67, pulse 68, temperature 98.1 F (36.7 C), temperature source Oral, resp. rate 16, height 5' 5.5" (1.664 m), weight 86.6 kg, SpO2 100%.   1. General Well Developed female, appears frail, laying in bed in no apparent distress  2. Normal affect and insight, Not Suicidal or Homicidal, Awake Alert, Oriented X 3.  3. No F.N deficits, ALL C.Nerves Intact, Strength 5/5 all 4 extremities, Sensation intact all 4 extremities, Plantars down going.  4. Ears and Eyes appear Normal, Conjunctivae clear, PERRLA.  Dry oral Mucosa.  5. Supple Neck, No JVD, No cervical lymphadenopathy appriciated, No Carotid Bruits.  6. Symmetrical Chest wall movement, Good air movement bilaterally, CTAB.  7. RRR, No Gallops, Rubs or Murmurs, No Parasternal Heave.  8. Positive Bowel Sounds, Abdomen Soft, No tenderness, No organomegaly appriciated,No rebound -guarding or rigidity.  9.  No Cyanosis, delayed skin Turgor, No Skin Rash or Bruise.  10. Good muscle tone,  joints appear normal , no effusions,  Normal ROM.    Data Review:    CBC Recent Labs  Lab 01/01/23 1221  WBC 5.3  HGB 12.4  HCT 39.8  PLT 389  MCV 86.7  MCH 27.0  MCHC 31.2  RDW 13.3  LYMPHSABS 1.9  MONOABS 0.4  EOSABS 0.3  BASOSABS 0.1   ------------------------------------------------------------------------------------------------------------------  Chemistries  Recent Labs  Lab 01/01/23 1221  NA 135  K 2.4*  CL 91*  CO2 34*  GLUCOSE 115*  BUN 17  CREATININE 1.22*  CALCIUM 9.4  MG 2.3  AST 17  ALT 14  ALKPHOS 96  BILITOT 0.3   ------------------------------------------------------------------------------------------------------------------ estimated creatinine clearance is 47.8 mL/min (A) (by C-G formula based on SCr of 1.22 mg/dL (H)). ------------------------------------------------------------------------------------------------------------------ No results for input(s): "TSH", "T4TOTAL", "T3FREE", "THYROIDAB" in the last 72 hours.  Invalid input(s): "FREET3"  Coagulation profile No results for input(s): "INR", "PROTIME" in the last 168 hours. ------------------------------------------------------------------------------------------------------------------- No results for input(s): "DDIMER" in the last 72 hours. -------------------------------------------------------------------------------------------------------------------  Cardiac Enzymes No results for input(s): "CKMB", "TROPONINI", "MYOGLOBIN" in the last 168 hours.  Invalid input(s): "CK" ------------------------------------------------------------------------------------------------------------------    Component Value Date/Time   BNP 26.0 08/19/2022 1554     ---------------------------------------------------------------------------------------------------------------  Urinalysis    Component Value Date/Time   COLORURINE STRAW (A) 01/01/2023 1455   APPEARANCEUR CLEAR 01/01/2023 1455   LABSPEC 1.008 01/01/2023 1455    PHURINE 7.0 01/01/2023 1455   GLUCOSEU NEGATIVE 01/01/2023 1455   HGBUR NEGATIVE 01/01/2023 1455   BILIRUBINUR NEGATIVE 01/01/2023 1455   KETONESUR NEGATIVE 01/01/2023 1455   PROTEINUR NEGATIVE 01/01/2023 1455   UROBILINOGEN 0.2 12/25/2013 2216   NITRITE NEGATIVE 01/01/2023 1455   LEUKOCYTESUR NEGATIVE 01/01/2023 1455    ----------------------------------------------------------------------------------------------------------------   Imaging Results:    CT Head Wo Contrast  Result Date: 01/01/2023 CLINICAL DATA:  Syncope/presyncope. Cerebrovascular cause suspected. EXAM: CT HEAD WITHOUT CONTRAST TECHNIQUE: Contiguous axial images were obtained from the base of the skull through the vertex without intravenous contrast. RADIATION DOSE REDUCTION: This exam was performed according to the departmental dose-optimization program which includes automated exposure control, adjustment of the mA and/or kV according to patient size and/or use of iterative reconstruction technique. COMPARISON:  10/01/2018 FINDINGS: Brain: The brain shows a normal appearance without evidence of malformation, atrophy, old or acute small or large vessel infarction, mass lesion, hemorrhage, hydrocephalus or extra-axial collection. Vascular: Minimal atherosclerotic calcification of the major vessels at the base of the brain. Skull: Normal.  No traumatic finding.  No focal bone lesion. Sinuses/Orbits: Sinuses are clear. Orbits appear normal. Mastoids are clear. Other: None significant IMPRESSION: Normal head CT. Minimal atherosclerotic calcification of the major vessels at the base of the brain. Electronically Signed   By: Paulina Fusi M.D.   On: 01/01/2023 14:47   DG Chest 2 View  Result Date: 01/01/2023 CLINICAL DATA:  Weakness EXAM: CHEST - 2 VIEW COMPARISON:  X-ray 06/11/2020 FINDINGS: Underinflation. Slight left basilar scar or atelectasis. No consolidation, pneumothorax or effusion. No edema. Normal cardiopericardial  silhouette. Degenerative changes along the spine. Fixation hardware along the lower cervical spine at the edge of the imaging field. Slight elevation of the right hemidiaphragm. IMPRESSION: Underinflation.  Slight left basilar scar or atelectasis. Slight elevation of the right hemidiaphragm Electronically Signed   By: Karen Kays M.D.   On: 01/01/2023 12:58     EKG:  Vent. rate 70 BPM PR interval 170 ms QRS duration 96 ms QT/QTcB 474/512 ms P-R-T axes 72 38 39 Sinus rhythm Low voltage, precordial leads Prolonged QT interval  Assessment & Plan:    Principal Problem:   Hypokalemia Active Problems:   DVT (deep venous thrombosis) (HCC)   HTN (hypertension)   QT prolongation    Hypokalemia -This is  most likely in setting for poor oral intake and poor appetite, she is still taking her Lasix -Will monitor on telemetry -Will hold Lasix and hydrochlorothiazide for now -Replete with IV and p.o. -Magnesium within normal limit -Recheck BMP this evening.  AKI Dehydration Failure to thrive -Hold Lasix and hydrochlorothiazide for now, keep on IV fluids -He is with decreased p.o. intake, will offer her some supplements, avoid Megace and the setting of her known history of DVT, and will avoid Remeron given prolonged QTc  Prolonged QTc -Likely due to hypokalemia, monitor on telemetry, replace and recheck in a.m. and avoid prolonging agents  History of DVT - Continue with Xarelto  Hypertension -Resume home dose metoprolol, hold hydrochlorothiazide, will add as needed hydralazine  Neuropathic pain -Continue with home dose gabapentin -Will hold Cymbalta given prolonged QTc  Diabetes mellitus -Patient with recurrent hypoglycemia recently to poor oral intake, so her glipizide has been discontinued recently -Check A1c and will keep on CBG monitoring and if elevated will start on sliding scale  Hyperlipidemia -Continue with Zetia      DVT Prophylaxis ON XARELTO  AM Labs Ordered,  also please review Full Orders  Family Communication: Admission, patients condition and plan of care including tests being ordered have been discussed with the patient and husband who indicate understanding and agree with the plan and Code Status.  Code Status full  Likely DC to  home  Condition GUARDED    Consults called: none    Admission status: observation    Time spent in minutes : 60 minutes   Huey Bienenstock M.D on 01/01/2023 at 4:04 PM   [facility] - Office  [phone]

## 2023-01-01 NOTE — ED Provider Notes (Signed)
[facility] EMERGENCY DEPARTMENT AT [facility] Provider Note   CSN: [id number] Arrival date & time: 01/01/23  1056     History  Chief Complaint  Patient presents with   Weakness    Amanda Peterson is a 67 y.o. female, history of breast cancer, hypertension, who presents to the ED secondary to generalized weakness, and fatigue that has been going on for the last 3 weeks.  She states 3 weeks ago she took a COVID and flu vaccine, and since then has just been very weak, persistently dizzy with a headache, and having some nausea and reduced appetite.  She states she does not want to eat or drink anything.  Denies any new abdominal pain, chest pain, or shortness of breath.  States she feels like the room is spinning, and having some nausea, as well as itching of the palms of her hands.  She notes that her headache is all over, and she has no vision changes, but the dizziness has been persistent, but worse when standing.  She denies any head trauma, has been compliant with her Eliquis.  Denies any changes in speech, confusion, weakness on one side of the body.     Home Medications Prior to Admission medications   Medication Sig Start Date End Date Taking? Authorizing Provider  acetaminophen (TYLENOL) 500 MG tablet Take 1,000 mg by mouth every 8 (eight) hours as needed for mild pain or moderate pain.   Yes [provider]  ALPRAZolam Prudy Feeler) 1 MG tablet Take 1 mg by mouth at bedtime as needed for sleep. 05/21/22  Yes [provider]  bisacodyl (BISACODYL) 5 MG EC tablet Take 10 mg by mouth daily as needed for moderate constipation.   Yes [provider]  DULoxetine (CYMBALTA) 60 MG capsule Take 60 mg by mouth daily.    Yes [provider]  ezetimibe (ZETIA) 10 MG tablet Take 10 mg by mouth daily. 05/16/20  Yes [provider]  fluticasone (FLONASE) 50 MCG/ACT nasal spray Place 1 spray into both nostrils daily. 08/27/22  Yes [provider]   furosemide (LASIX) 40 MG tablet Take 0.5 tablets (20 mg total) by mouth daily. 08/19/22  Yes Bethann Berkshire, MD  gabapentin (NEURONTIN) 300 MG capsule Take 300 mg by mouth daily. Takes in the evening in addition to 600 mg dose 07/02/22  Yes [provider]  gabapentin (NEURONTIN) 600 MG tablet Take 600 mg by mouth 3 (three) times daily. 06/18/21  Yes [provider]  hydrochlorothiazide (HYDRODIURIL) 25 MG tablet Take 25 mg by mouth daily.   Yes [provider]  levocetirizine (XYZAL) 5 MG tablet Take 5 mg by mouth daily.   Yes [provider]  LORazepam (ATIVAN) 0.5 MG tablet Take 0.5 mg by mouth every 8 (eight) hours as needed for anxiety. 11/19/22  Yes [provider]  metoprolol succinate (TOPROL-XL) 50 MG 24 hr tablet Take 50 mg by mouth every morning. Take with or immediately following a meal.   Yes [provider]  rivaroxaban (XARELTO) 20 MG TABS tablet Take 1 tablet (20 mg total) by mouth daily with supper. Take with food. 12/24/22  Yes Nada Libman, MD  sucralfate (CARAFATE) 1 g tablet Take 1 g by mouth 4 (four) times daily. 03/19/22  Yes [provider]  zolpidem (AMBIEN) 10 MG tablet Take 10 mg by mouth at bedtime.   Yes [provider]  glipiZIDE (GLUCOTROL) 5 MG tablet Take 5 mg by mouth daily before breakfast.  [provider]      Allergies    Hydrocodone, Tramadol, Oxycodone, Tramadol hcl, Codeine, Darvocet [propoxyphene n-acetaminophen], and Eliquis [apixaban]    Review of Systems   Review of Systems  Respiratory:  Negative for shortness of breath.   Neurological:  Positive for weakness.    Physical Exam Updated Vital Signs BP 119/67   Pulse 68   Temp 98.1 F (36.7 C) (Oral)   Resp 16   Ht 5' 5.5" (1.664 m)   Wt 86.6 kg   SpO2 100%   BMI 31.30 kg/m  Physical Exam Vitals and nursing note reviewed.  Constitutional:      General: She is not in acute distress.    Appearance: She is  well-developed.  HENT:     Head: Normocephalic and atraumatic.     Right Ear: There is impacted cerumen.     Left Ear: There is impacted cerumen.     Mouth/Throat:     Mouth: Mucous membranes are dry.  Eyes:     Conjunctiva/sclera: Conjunctivae normal.  Cardiovascular:     Rate and Rhythm: Normal rate and regular rhythm.     Heart sounds: No murmur heard. Pulmonary:     Effort: Pulmonary effort is normal. No respiratory distress.     Breath sounds: Normal breath sounds.  Abdominal:     Palpations: Abdomen is soft.     Tenderness: There is no abdominal tenderness.  Musculoskeletal:        General: No swelling.     Cervical back: Neck supple.  Skin:    General: Skin is warm and dry.     Capillary Refill: Capillary refill takes less than 2 seconds.  Neurological:     General: No focal deficit present.     Mental Status: She is alert and oriented to person, place, and time.  Psychiatric:        Mood and Affect: Mood normal.     ED Results / Procedures / Treatments   Labs (all labs ordered are listed, but only abnormal results are displayed) Labs Reviewed  COMPREHENSIVE METABOLIC PANEL - Abnormal; Notable for the following components:      Result Value   Potassium 2.4 (*)    Chloride 91 (*)    CO2 34 (*)    Glucose, Bld 115 (*)    Creatinine, Ser 1.22 (*)    GFR, Estimated 48 (*)    All other components within normal limits  URINALYSIS, ROUTINE W REFLEX MICROSCOPIC - Abnormal; Notable for the following components:   Color, Urine STRAW (*)    All other components within normal limits  RESP PANEL BY RT-PCR (RSV, FLU A&B, COVID)  [id number]  CBC WITH DIFFERENTIAL/PLATELET  LIPASE, BLOOD  MAGNESIUM    EKG None  Radiology CT Head Wo Contrast  Result Date: 01/01/2023 CLINICAL DATA:  Syncope/presyncope. Cerebrovascular cause suspected. EXAM: CT HEAD WITHOUT CONTRAST TECHNIQUE: Contiguous axial images were obtained from the base of the skull through the vertex without  intravenous contrast. RADIATION DOSE REDUCTION: This exam was performed according to the departmental dose-optimization program which includes automated exposure control, adjustment of the mA and/or kV according to patient size and/or use of iterative reconstruction technique. COMPARISON:  10/01/2018 FINDINGS: Brain: The brain shows a normal appearance without evidence of malformation, atrophy, old or acute Allexa Acoff or large vessel infarction, mass lesion, hemorrhage, hydrocephalus or extra-axial collection. Vascular: Minimal atherosclerotic calcification of the major vessels at the base of the brain. Skull: Normal.  No traumatic finding.  No focal bone lesion. Sinuses/Orbits: Sinuses are clear. Orbits appear normal. Mastoids are clear. Other: None significant IMPRESSION: Normal head CT. Minimal atherosclerotic calcification of the major vessels at the base of the brain. Electronically Signed   By: Paulina Fusi M.D.   On: 01/01/2023 14:47   DG Chest 2 View  Result Date: 01/01/2023 CLINICAL DATA:  Weakness EXAM: CHEST - 2 VIEW COMPARISON:  X-ray 06/11/2020 FINDINGS: Underinflation. Slight left basilar scar or atelectasis. No consolidation, pneumothorax or effusion. No edema. Normal cardiopericardial silhouette. Degenerative changes along the spine. Fixation hardware along the lower cervical spine at the edge of the imaging field. Slight elevation of the right hemidiaphragm. IMPRESSION: Underinflation.  Slight left basilar scar or atelectasis. Slight elevation of the right hemidiaphragm Electronically Signed   By: Karen Kays M.D.   On: 01/01/2023 12:58    Procedures Procedures    Medications Ordered in ED Medications  potassium chloride 10 mEq in 100 mL IVPB (10 mEq Intravenous New Bag/Given 01/01/23 1539)  0.9 % NaCl with KCl 40 mEq / L  infusion (has no administration in time range)  potassium chloride (KLOR-CON) packet 40 mEq (40 mEq Oral Given 01/01/23 1311)  sodium chloride 0.9 % bolus 500 mL (0 mLs  Intravenous Stopped 01/01/23 1540)    ED Course/ Medical Decision Making/ A&P                                 Medical Decision Making Patient is a 67 year old female, here for generalized weakness, and dizziness after getting COVID/flu vaccine about 3 weeks ago.  She denies any shortness of breath, chest pain, or abdominal pain.  States she has had a headache, and bit a little bit dizzy.  She has no neurodeficits on exam, we will obtain a head CT, for further evaluation as well as blood work, to check out electrolytes, and chest x-ray, to evaluate for any subtle pneumonia  Amount and/or Complexity of Data Reviewed Labs: ordered.    Details: Hypokalemia of 2.4, magnesium within normal limits, no other severe electrolyte derangements Radiology: ordered.    Details: CT head, shows no acute findings chest x-ray is reassuring ECG/medicine tests:  Decision-making details documented in ED Course. Discussion of management or test interpretation with external provider(s): Discussed with patient, she is still bearing feeling very weak, and dizzy.  Dizziness may be secondary to orthostasis,?,  Versus hypokalemia.  She is receiving IV 40 mEq of potassium as well as oral potassium.  I am suspicious that this may be causing the dizziness, as she has no acute neurofindings, and her head CT is reassuring, and has been 3 weeks since she has had this dizziness, thus a CVA would likely show up on head CT.  We will admit to Dr. Randol Kern for IV potassium, further hydration, and physical therapy, given dizziness.  She does have some impaction of ears this may be causing some dizziness, however thought to be unlikely  Risk Prescription drug management. Decision regarding hospitalization.    Final Clinical Impression(s) / ED Diagnoses Final diagnoses:  Weakness  Dizziness  Hypokalemia    Rx / DC Orders ED Discharge Orders     None         Pete Pelt, PA 01/01/23 1555    Glendora Score,  MD 01/01/23 2039

## 2023-01-02 DIAGNOSIS — R531 Weakness: Secondary | ICD-10-CM

## 2023-01-02 DIAGNOSIS — E876 Hypokalemia: Secondary | ICD-10-CM | POA: Diagnosis not present

## 2023-01-02 DIAGNOSIS — I10 Essential (primary) hypertension: Secondary | ICD-10-CM

## 2023-01-02 DIAGNOSIS — I825Y9 Chronic embolism and thrombosis of unspecified deep veins of unspecified proximal lower extremity: Secondary | ICD-10-CM

## 2023-01-02 LAB — BASIC METABOLIC PANEL
Anion gap: 7 (ref 5–15)
BUN: 13 mg/dL (ref 8–23)
CO2: 29 mmol/L (ref 22–32)
Calcium: 8.8 mg/dL — ABNORMAL LOW (ref 8.9–10.3)
Chloride: 101 mmol/L (ref 98–111)
Creatinine, Ser: 0.98 mg/dL (ref 0.44–1.00)
GFR, Estimated: >60 mL/min (ref >60)
Glucose, Bld: 110 mg/dL — ABNORMAL HIGH (ref 70–99)
Potassium: 3.4 mmol/L — ABNORMAL LOW (ref 3.5–5.1)
Sodium: 137 mmol/L (ref 135–145)

## 2023-01-02 LAB — GLUCOSE, CAPILLARY
Glucose-Capillary: 108 mg/dL — ABNORMAL HIGH (ref 70–99)
Glucose-Capillary: 167 mg/dL — ABNORMAL HIGH (ref 70–99)

## 2023-01-02 LAB — CBC
HCT: 35.6 % — ABNORMAL LOW (ref 36.0–46.0)
Hemoglobin: 11.1 g/dL — ABNORMAL LOW (ref 12.0–15.0)
MCH: 27.3 pg (ref 26.0–34.0)
MCHC: 31.2 g/dL (ref 30.0–36.0)
MCV: 87.7 fL (ref 80.0–100.0)
Platelets: 329 10*3/uL (ref 150–400)
RBC: 4.06 MIL/uL (ref 3.87–5.11)
RDW: 13.3 % (ref 11.5–15.5)
WBC: 6.3 10*3/uL (ref 4.0–10.5)
nRBC: 0 % (ref 0.0–0.2)

## 2023-01-02 LAB — HIV ANTIBODY (ROUTINE TESTING W REFLEX): HIV Screen 4th Generation wRfx: NONREACTIVE

## 2023-01-02 MED ORDER — POTASSIUM CHLORIDE IN NACL 40-0.9 MEQ/L-% IV SOLN: INTRAVENOUS | Status: AC

## 2023-01-02 MED ORDER — FUROSEMIDE 40 MG PO TABS: 40.0000 mg | ORAL_TABLET | Freq: Every day | ORAL | 1 refills | Status: DC

## 2023-01-02 MED ORDER — POTASSIUM CHLORIDE CRYS ER 20 MEQ PO TBCR
40.0000 meq | EXTENDED_RELEASE_TABLET | Freq: Every day | ORAL | Status: DC
  Administered 2023-01-02: 40 meq via ORAL
  Filled 2023-01-02: qty 2

## 2023-01-02 MED ORDER — POTASSIUM CHLORIDE CRYS ER 20 MEQ PO TBCR: 40.0000 meq | EXTENDED_RELEASE_TABLET | Freq: Every day | ORAL | 1 refills | Status: DC

## 2023-01-02 NOTE — Discharge Summary (Signed)
Physician Discharge Summary   Patient: Amanda Peterson MRN: [id number] DOB: [birth year]  Admit date:     01/01/2023  Discharge date: 01/02/23  Discharge Physician: Vassie Loll   PCP: Karenann Cai, NP   Recommendations at discharge:  Repeat CBC to follow hemoglobin trend/stability Repeat basic metabolic panel to follow electrolytes and renal function Reassess blood pressure and adjust antihypertensive treatment as required. Close monitoring of patient's CBGs/A1c with further adjustment to hypoglycemic regimen as required.  Discharge Diagnoses: Principal Problem:   Hypokalemia Active Problems:   DVT (deep venous thrombosis) (HCC)   HTN (hypertension)   QT prolongation   Weakness  Brief Hospital Course: Amanda Peterson  is a 67 y.o. female, with past medical history of breast cancer, hypertension, diabetes, chronic back pain, radiculopathy, patient presents to ED secondary to complaints of generalized weakness, fatigue and poor oral intake, patient reports she took her COVID-vaccine and flu vaccine 3 weeks ago, but since she has been feeling very weak, fatigued, with very poor oral intake, report despite that she kept taking her blood pressure medicine including hydrochlorothiazide and Lasix, [place] any abdominal pain, chest pain, nausea, vomiting, diarrhea, reports some itching in her palms of the hands, and reports some headache, denies any vision changes, loss of consciousness or focal deficits. -In ED her EKG noted to have prolonged QTc at 512, she appears clinically dehydrated, with elevated creatinine at 1.22, with low potassium at 2.4, resume was within normal limit at 2.3, her UA was negative, her CO2 was 34, and blood glucose was 115, [facility] hospital[facility] consulted to place in the hospital for further evaluation and management.  Assessment and Plan: 1-hypokalemia in the setting of decreased oral intake and continue use of Lasix and HCTZ -Patient instructed to maintain adequate  nutrition Hydration -HCTZ discontinued discharge -Safe for to reduce patient's Lasix but now with daily potassium supplementation. -Reassess lites metabolic panel follow-up visit.  2-acute kidney injury/dehydration/anorexia -Patient instructed to maintain adequate hydration and to start using as needed feeding supplements -No nausea, no vomiting, no abdominal pain. -After fluid resuscitation renal function back to normal range -Repeat basic metabolic panel follow-up visit.  3-prolonged QT -Continue minimizing agents that can further prolong QT -Patient has been started on daily potassium for electrolyte supplementation.  4-history of DVT -Continue treatment with Xarelto. -Follow hemoglobin trend/stability with repeat CBC at follow-up visit. -No overt bleeding currently appreciated.  5-hypertension -Resume home antihypertensive regimen except for HCTZ -Patient advised to maintain adequate hydration and to follow heart healthy diet.  6-neuropathy -Continue home gabapentin and resume Cymbalta.  7-type 2 diabetes mellitus with neuropathy -Resume home hypoglycemic agents -Patient advised not to skip any meals and to maintain adequate hydration. -Continue patient follow-up to further adjust medication as needed.  8-dyslipidemia -Continue Zetia -Healthy diet discussed with patient.  9-class 1 obesity -Low-calorie diet and portion control discussed with patient. -Body mass index is 30.93 kg/m.   Consultants: None Procedures performed: See below for x-ray reports. Disposition: Home Diet recommendation: Heart healthy/modified carbohydrate diet.  DISCHARGE MEDICATION: Allergies as of 01/02/2023       Reactions   Hydrocodone Anaphylaxis, Hives   Tramadol Anaphylaxis   Oxycodone Other (See Comments)   Burning to lips   Tramadol Hcl Hives   Codeine Rash, Hives   Darvocet [propoxyphene N-acetaminophen] Rash   Eliquis [apixaban] Itching        Medication List     STOP  taking these medications    ciprofloxacin 500 MG tablet Commonly known as: CIPRO  hydrochlorothiazide 25 MG tablet Commonly known as: HYDRODIURIL       TAKE these medications    acetaminophen 500 MG tablet Commonly known as: TYLENOL Take 1,000 mg by mouth every 8 (eight) hours as needed for mild pain or moderate pain.   ALPRAZolam 1 MG tablet Commonly known as: XANAX Take 1 mg by mouth at bedtime as needed for sleep.   bisacodyl 5 MG EC tablet Generic drug: bisacodyl Take 10 mg by mouth daily as needed for moderate constipation.   DULoxetine 60 MG capsule Commonly known as: CYMBALTA Take 60 mg by mouth daily.   ezetimibe 10 MG tablet Commonly known as: ZETIA Take 10 mg by mouth daily.   fluticasone 50 MCG/ACT nasal spray Commonly known as: FLONASE Place 1 spray into both nostrils daily.   furosemide 40 MG tablet Commonly known as: LASIX Take 1 tablet (40 mg total) by mouth daily. What changed: how much to take   gabapentin 600 MG tablet Commonly known as: NEURONTIN Take 600 mg by mouth 3 (three) times daily. What changed: Another medication with the same name was removed. Continue taking this medication, and follow the directions you see here.   glipiZIDE 5 MG tablet Commonly known as: GLUCOTROL Take 5 mg by mouth daily before breakfast.   levocetirizine 5 MG tablet Commonly known as: XYZAL Take 5 mg by mouth daily.   LORazepam 0.5 MG tablet Commonly known as: ATIVAN Take 0.5 mg by mouth every 8 (eight) hours as needed for anxiety.   metoprolol succinate 50 MG 24 hr tablet Commonly known as: TOPROL-XL Take 50 mg by mouth every morning. Take with or immediately following a meal.   potassium chloride SA 20 MEQ tablet Commonly known as: KLOR-CON M Take 2 tablets (40 mEq total) by mouth daily. Start taking on: January 03, 2023   rivaroxaban 20 MG Tabs tablet Commonly known as: Xarelto Take 1 tablet (20 mg total) by mouth daily with supper. Take with  food.   sucralfate 1 g tablet Commonly known as: CARAFATE Take 1 g by mouth 4 (four) times daily.   zolpidem 10 MG tablet Commonly known as: AMBIEN Take 10 mg by mouth at bedtime.        Follow-up Information     Karenann Cai, NP. Schedule an appointment as soon as possible for a visit in 10 day(s).   Specialty: Nurse Practitioner Contact information: [address] [place] [address] [phone]                Discharge Exam: Ceasar Mons Weights   01/01/23 1124 01/01/23 1748  Weight: 86.6 kg 85.6 kg   General exam: Alert, awake, oriented x 3 Respiratory system: Clear to auscultation. Respiratory effort normal. Cardiovascular system:RRR. No murmurs, rubs, gallops. Gastrointestinal system: Abdomen is nondistended, soft and nontender. No organomegaly or masses felt. Normal bowel sounds heard. Central nervous system: Alert and oriented. No focal neurological deficits. Extremities: No C/C/E, +pedal pulses Skin: No rashes, lesions or ulcers Psychiatry: Judgement and insight appear normal. Mood & affect appropriate.    Condition at discharge: Stable and improved.  The results of significant diagnostics from this hospitalization (including imaging, microbiology, ancillary and laboratory) are listed below for reference.   Imaging Studies: CT Head Wo Contrast  Result Date: 01/01/2023 CLINICAL DATA:  Syncope/presyncope. Cerebrovascular cause suspected. EXAM: CT HEAD WITHOUT CONTRAST TECHNIQUE: Contiguous axial images were obtained from the base of the skull through the vertex without intravenous contrast. RADIATION DOSE REDUCTION: This exam was performed  according to the departmental dose-optimization program which includes automated exposure control, adjustment of the mA and/or kV according to patient size and/or use of iterative reconstruction technique. COMPARISON:  10/01/2018 FINDINGS: Brain: The brain shows a normal appearance without evidence of malformation,  atrophy, old or acute small or large vessel infarction, mass lesion, hemorrhage, hydrocephalus or extra-axial collection. Vascular: Minimal atherosclerotic calcification of the major vessels at the base of the brain. Skull: Normal.  No traumatic finding.  No focal bone lesion. Sinuses/Orbits: Sinuses are clear. Orbits appear normal. Mastoids are clear. Other: None significant IMPRESSION: Normal head CT. Minimal atherosclerotic calcification of the major vessels at the base of the brain. Electronically Signed   By: Paulina Fusi M.D.   On: 01/01/2023 14:47   DG Chest 2 View  Result Date: 01/01/2023 CLINICAL DATA:  Weakness EXAM: CHEST - 2 VIEW COMPARISON:  X-ray 06/11/2020 FINDINGS: Underinflation. Slight left basilar scar or atelectasis. No consolidation, pneumothorax or effusion. No edema. Normal cardiopericardial silhouette. Degenerative changes along the spine. Fixation hardware along the lower cervical spine at the edge of the imaging field. Slight elevation of the right hemidiaphragm. IMPRESSION: Underinflation.  Slight left basilar scar or atelectasis. Slight elevation of the right hemidiaphragm Electronically Signed   By: Karen Kays M.D.   On: 01/01/2023 12:58    Microbiology: Results for orders placed or performed during the hospital encounter of 01/01/23  Resp panel by RT-PCR (RSV, Flu A&B, Covid) Anterior Nasal Swab     Status: None   Collection Time: 01/01/23  1:14 PM   Specimen: Anterior Nasal Swab  Result Value Ref Range Status   SARS Coronavirus 2 by RT PCR NEGATIVE NEGATIVE Final    Comment: (NOTE) SARS-CoV-2 target nucleic acids are NOT DETECTED.  The SARS-CoV-2 RNA is generally detectable in upper respiratory specimens during the acute phase of infection. The lowest concentration of SARS-CoV-2 viral copies this assay can detect is 138 copies/mL. A negative result does not preclude SARS-Cov-2 infection and should not be used as the sole basis for treatment or other patient  management decisions. A negative result may occur with  improper specimen collection/handling, submission of specimen other than nasopharyngeal swab, presence of viral mutation(s) within the areas targeted by this assay, and inadequate number of viral copies(<138 copies/mL). A negative result must be combined with clinical observations, patient history, and epidemiological information. The expected result is Negative.  Fact Sheet for Patients:  BloggerCourse.com  Fact Sheet for Healthcare Providers:  SeriousBroker.it  This test is no t yet approved or cleared by the [place] FDA and  has been authorized for detection and/or diagnosis of SARS-CoV-2 by FDA under an Emergency Use Authorization (EUA). This EUA will remain  in effect (meaning this test can be used) for the duration of the COVID-19 declaration under Section 564(b)(1) of the Act, 21 U.S.C.section 360bbb-3(b)(1), unless the authorization is terminated  or revoked sooner.       Influenza A by PCR NEGATIVE NEGATIVE Final   Influenza B by PCR NEGATIVE NEGATIVE Final    Comment: (NOTE) The Xpert Xpress SARS-CoV-2/FLU/RSV plus assay is intended as an aid in the diagnosis of influenza from Nasopharyngeal swab specimens and should not be used as a sole basis for treatment. Nasal washings and aspirates are unacceptable for Xpert Xpress SARS-CoV-2/FLU/RSV testing.  Fact Sheet for Patients: BloggerCourse.com  Fact Sheet for Healthcare Providers: SeriousBroker.it  This test is not yet approved or cleared by the [place] FDA and has been authorized for detection and/or  diagnosis of SARS-CoV-2 by FDA under an Emergency Use Authorization (EUA). This EUA will remain in effect (meaning this test can be used) for the duration of the COVID-19 declaration under Section 564(b)(1) of the Act, 21 U.S.C. section 360bbb-3(b)(1),  unless the authorization is terminated or revoked.     Resp Syncytial Virus by PCR NEGATIVE NEGATIVE Final    Comment: (NOTE) Fact Sheet for Patients: BloggerCourse.com  Fact Sheet for Healthcare Providers: SeriousBroker.it  This test is not yet approved or cleared by the [place] FDA and has been authorized for detection and/or diagnosis of SARS-CoV-2 by FDA under an Emergency Use Authorization (EUA). This EUA will remain in effect (meaning this test can be used) for the duration of the COVID-19 declaration under Section 564(b)(1) of the Act, 21 U.S.C. section 360bbb-3(b)(1), unless the authorization is terminated or revoked.  Performed at [facility], [address] [place], [place] [address]     Labs: CBC: Recent Labs  Lab 01/01/23 1221 01/02/23 0434  WBC 5.3 6.3  NEUTROABS 2.7  --   HGB 12.4 11.1*  HCT 39.8 35.6*  MCV 86.7 87.7  PLT 389 329   Basic Metabolic Panel: Recent Labs  Lab 01/01/23 1221 01/01/23 2222 01/02/23 0434  NA 135 137 137  K 2.4* 3.3* 3.4*  CL 91* 98 101  CO2 34* 31 29  GLUCOSE 115* 123* 110*  BUN 17 14 13   CREATININE 1.22* 0.95 0.98  CALCIUM 9.4 9.1 8.8*  MG 2.3  --   --    Liver Function Tests: Recent Labs  Lab 01/01/23 1221  AST 17  ALT 14  ALKPHOS 96  BILITOT 0.3  PROT 7.7  ALBUMIN 3.8   CBG: Recent Labs  Lab 01/01/23 1807 01/01/23 1828 01/01/23 1855 01/02/23 0745 01/02/23 1147  GLUCAP 65* 66* 106* 108* 167*    Discharge time spent: greater than 30 minutes.  Signed: Vassie Loll, MD [facility] 01/02/2023

## 2023-01-02 NOTE — Evaluation (Signed)
Physical Therapy Evaluation Patient Details Name: Amanda Peterson MRN: [id number] DOB: 20-Feb-1955 Today's Date: 01/02/2023  History of Present Illness  Amanda Peterson  is a 67 y.o. female, with past medical history of breast cancer, hypertension, diabetes, chronic back pain, radiculopathy, patient presents to ED secondary to complaints of generalized weakness, fatigue and poor oral intake, patient reports she took her COVID-vaccine and flu vaccine 3 weeks ago, but since she has been feeling very weak, fatigued, with very poor oral intake, report despite that she kept taking her blood pressure medicine including hydrochlorothiazide and Lasix, [place] any abdominal pain, chest pain, nausea, vomiting, diarrhea, reports some itching in her palms of the hands, and reports some headache, denies any vision changes, loss of consciousness or focal deficits.   Clinical Impression  Patient functioning near baseline for functional mobility and gait other than requiring use of RW for gait training due to slightly unsteady requiring hand held assist when taking steps without AD.  Patient demonstrates good return for ambulation in room/hallways using RW without loss of balance.  PLAN:  Patient to be discharged home today and discharged from acute physical therapy to care of nursing for ambulation as tolerated for length of stay with recommendations stated below          If plan is discharge home, recommend the following: A little help with walking and/or transfers;A little help with bathing/dressing/bathroom;Assistance with cooking/housework;Help with stairs or ramp for entrance   Can travel by private vehicle        Equipment Recommendations None recommended by PT  Recommendations for Other Services       Functional Status Assessment Patient has had a recent decline in their functional status and demonstrates the ability to make significant improvements in function in a reasonable and predictable amount of time.      Precautions / Restrictions Precautions Precautions: Fall Restrictions Weight Bearing Restrictions: No      Mobility  Bed Mobility Overal bed mobility: Modified Independent                  Transfers Overall transfer level: Needs assistance Equipment used: Rolling walker (2 wheels), None, 1 person hand held assist Transfers: Sit to/from Stand, Bed to chair/wheelchair/BSC Sit to Stand: Supervision, Contact guard assist   Step pivot transfers: Supervision, Contact guard assist       General transfer comment: slow labored movement with decreased balance, safer using RW    Ambulation/Gait Ambulation/Gait assistance: Supervision Gait Distance (Feet): 100 Feet Assistive device: Rolling walker (2 wheels), None Gait Pattern/deviations: Decreased step length - right, Decreased step length - left, Decreased stride length, Drifts right/left Gait velocity: decreased     General Gait Details: able to take a few steps without AD, but labored movement with occasional drifting left/right requiring hand held assist, safer using RW with good return for use demonstrated without loss of balance  Stairs            Wheelchair Mobility     Tilt Bed    Modified Rankin (Stroke Patients Only)       Balance Overall balance assessment: Needs assistance Sitting-balance support: Feet supported, No upper extremity supported Sitting balance-Leahy Scale: Good Sitting balance - Comments: seated at bedside   Standing balance support: During functional activity, No upper extremity supported Standing balance-Leahy Scale: Poor Standing balance comment: fair/poor without AD, fair/good using RW  Pertinent Vitals/Pain Pain Assessment Pain Assessment: No/denies pain    Home Living Family/patient expects to be discharged to:: Private residence Living Arrangements: Spouse/significant other Available Help at Discharge: Family;Available 24  hours/day Type of Home: House Home Access: Stairs to enter Entrance Stairs-Rails: None Entrance Stairs-Number of Steps: 1   Home Layout: One level Home Equipment: Agricultural consultant (2 wheels);Grab bars - tub/shower;BSC/3in1;Shower seat      Prior Function Prior Level of Function : Independent/Modified Independent;Driving             Mobility Comments: Community ambulation without AD, drives ADLs Comments: Independent     Extremity/Trunk Assessment   Upper Extremity Assessment Upper Extremity Assessment: Overall WFL for tasks assessed    Lower Extremity Assessment Lower Extremity Assessment: Generalized weakness    Cervical / Trunk Assessment Cervical / Trunk Assessment: Normal  Communication   Communication Communication: No apparent difficulties  Cognition Arousal: Alert Behavior During Therapy: WFL for tasks assessed/performed Overall Cognitive Status: Within Functional Limits for tasks assessed                                          General Comments      Exercises     Assessment/Plan    PT Assessment All further PT needs can be met in the next venue of care  PT Problem List Decreased strength;Decreased activity tolerance;Decreased balance;Decreased mobility       PT Treatment Interventions      PT Goals (Current goals can be found in the Care Plan section)  Acute Rehab PT Goals Patient Stated Goal: return home with family to assist PT Goal Formulation: With patient/family Time For Goal Achievement: 01/02/23 Potential to Achieve Goals: Good    Frequency       Co-evaluation               AM-PAC PT "6 Clicks" Mobility  Outcome Measure Help needed turning from your back to your side while in a flat bed without using bedrails?: None Help needed moving from lying on your back to sitting on the side of a flat bed without using bedrails?: None Help needed moving to and from a bed to a chair (including a wheelchair)?: A  Little Help needed standing up from a chair using your arms (e.g., wheelchair or bedside chair)?: A Little Help needed to walk in hospital room?: A Little Help needed climbing 3-5 steps with a railing? : A Little 6 Click Score: 20    End of Session   Activity Tolerance: Patient tolerated treatment well;Patient limited by fatigue Patient left: in chair;with call bell/phone within reach;with family/visitor present Nurse Communication: Mobility status PT Visit Diagnosis: Unsteadiness on feet (R26.81);Other abnormalities of gait and mobility (R26.89);Muscle weakness (generalized) (M62.81)    Time: 1130-1156 PT Time Calculation (min) (ACUTE ONLY): 26 min   Charges:   PT Evaluation $PT Eval Moderate Complexity: 1 Mod PT Treatments $Therapeutic Activity: 23-37 mins PT General Charges $$ ACUTE PT VISIT: 1 Visit         2:01 PM, 01/02/23 Ocie Bob, MPT Physical Therapist with [facility] [phone] office [phone] mobile phone

## 2023-01-02 NOTE — TOC Transition Note (Signed)
Transition of Care [facility]) - CM/SW Discharge Note   Patient Details  Name: Amanda Peterson MRN: [id number] Date of Birth: 12/03/1955  [facility]) CM/SW Contact:  Karn Cassis, LCSW Phone Number: 01/02/2023, 1:46 PM   Clinical Narrative:  PT evaluated pt and recommend HHPT. Pt states she has walker and cane at home. Discussed HHPT and pt agreeable with no preference on agency. Referred and accepted by [facility] with [facility]urb[facility]. Cory aware of d/c today. LCSW requested MD enter HHPT order.      Final next level of care: Home w Home Health Services Barriers to Discharge: Barriers Resolved   Patient Goals and CMS Choice      Discharge Placement                      Patient and family notified of of transfer: 01/02/23  Discharge Plan and Services Additional resources added to the After Visit Summary for                            [facility] Arranged: PT HH Agency: [facility] Date [facility] Agency Contacted: 01/02/23 Time HH Agency Contacted: 1346 Representative spoke with at [facility] Agency: Kandee Keen  Social Determinants of Health (SDOH) Interventions SDOH Screenings   Food Insecurity: No Food Insecurity (01/01/2023)  Housing: Patient Declined (01/01/2023)  Transportation Needs: No Transportation Needs (01/01/2023)  Utilities: Not At Risk (01/01/2023)  Tobacco Use: Low Risk  (01/01/2023)     Readmission Risk Interventions     No data to display

## 2023-02-03 NOTE — Progress Notes (Addendum)
 DVT Clinic Note  Name: Amanda Peterson     MRN: [id number]     DOB: 1955/04/25     Sex: female  PCP: Sharyne Harlene CROME, NP  Today's Visit: Visit Information: Follow Up Visit  Referred to DVT Clinic by:  Lonni Lites, PA-C  - [facility] Referred to CPP by: Dr. Serene Reason for referral:  Chief Complaint  Patient presents with   Med Management - DVT   HISTORY OF PRESENT ILLNESS: Amanda Peterson is a 68 y.o. female who presents after diagnosis of DVT on 04/02/22 for medication management. PMH includes DVT (2016, 2021, 2024), PE (2021), post-thrombotic syndrome, breast cancer (in remission), anxiety, chronic neck and back pain. DVT in 2016 was s/p knee surgery. In 2021, she was found to have a LLE DVT and PE s/p COVID infection, complicated by a GI bleed requiring use of IVC filter. Patient reports that oncologist identified a lab marker making her more susceptible to clotting, but she is unsure of the marker. Anticoagulation was resumed by oncology at that time to reduce future VTE risk, but the patient ultimately self-discontinued Xarelto  due to cost (was $450/mo). She was then found to have a new DVT involving her L popliteal vein on 04/02/22, for which she was restarted on Xarelto  ($47/mo) indefinitely. At DVT clinic follow-up on 07/10/22 swelling had returned to baseline and she was discharged from clinic. On 12/11/22 pt called to report Xarelto  price increased (donut hole), and she was scheduled for follow-up prior to running out of medication. As a result, at last DVT clinic follow-up on 12/12/22 we transitioned from Xarelto  to Eliquis  due to cost so that she could utilize the free trial card and Eliquis  samples to bridge her to 2025 when the donut hole will no longer be an issue.   Patient called in on 12/24/22 reporting full body itching and dizziness after switching from Xarelto  to Eliquis . She was switched back to Xarelto , despite needing to pay ~$140 for a 1 mo supply to carry her over  into the new year. Pt was subsequently hospitalized from 01/01/23 to 01/02/23 for generalized weakness, fatigue, and poor PO intake since receiving COVID and flu vaccine 3 weeks prior. She continued to report itchiness in the palms of her hands. Noted to have prolonged QTC at 512, elevated Cr 1.22, K 2.4. Electrolyte abnormalities thought to be related to continued intake of loop diuretic and hydrochlorothiazide in the setting of poor PO intake. Hydrochlorothiazide discontinued at discharge and K supplement initiated. Cr improved to 0.8 (eGFR > 60) at discharge.   Today patient reports that she is feeling better since hospital discharge. She has seen her PCP, has had labs drawn (though not visible in Epic), and has follow-up scheduled for 02/14/23. They decreased her potassium supplementation based on the results. She reports that her dizziness and itchiness has resolved since discharge, and she feels her improvement was also related from transitioning back from Eliquis  to Xarelto . Denies having a rash along with the full body itchiness. She has new insurance and brings her card with her today - though we were unable to run a test claim in [facility]. She continues to have severe nerve pain through her back and lower extremity, which is being managed by PCP and spine specialist. She denies missed doses of Xarelto . Denies bleeding or bruising. Continues to wear compression stockings daily.  Positive Thrombotic Risk Factors: Previous VTE, Known thrombophilic condition, Obesity, Older Age Bleeding Risk Factors: Age >65 years, Previous bleeding, Anticoagulant therapy  Negative Thrombotic Risk Factors: Recent surgery (within 3 months), Recent trauma (within 3 months), Paralysis, paresis, or recent plaster cast immobilization of lower extremity, Central venous catheterization, Bed rest >72 hours within 3 months, Sedentary journey lasting >8 hours within 4 weeks, Pregnancy, Recent cesarean section (within 3 months), Within 6  weeks postpartum, Estrogen therapy, Testosterone therapy, Erythropoiesis-stimulating agent, Recent COVID diagnosis (within 3 months), Active cancer, Non-malignant, chronic inflammatory condition, Smoking, Recent admission to hospital with acute illness (within 3 months)  Rx Insurance Coverage: Medicare Rx Affordability: Pt struggled with DOAC affordability in 2024 due to the donut hole (Xarelto  increased from $47/mo to $147/mo), expect copay to  be $47/month after $255 deductible in 2025.  Rx Assistance Provided:  Patient has used Eliquis  and Xarelto  free-trial cards.  Preferred Pharmacy: [facility] on [address] in [place], [place]  Past Medical History:  Diagnosis Date   Anxiety    Breast cancer (HCC)    Chronic back pain    Chronic neck pain    Depression    High cholesterol    History of suicidal ideation    Hypertension    Lumbar radiculopathy     Past Surgical History:  Procedure Laterality Date   BACK SURGERY     BREAST LUMPECTOMY     CERVICAL DISC SURGERY     SHOULDER SURGERY     THYROID SURGERY Right     Social History   Socioeconomic History   Marital status: Married    Spouse name: Not on file   Number of children: Not on file   Years of education: Not on file   Highest education level: Not on file  Occupational History   Not on file  Tobacco Use   Smoking status: Never   Smokeless tobacco: Never  Vaping Use   Vaping status: Never Used  Substance and Sexual Activity   Alcohol use: No   Drug use: No   Sexual activity: Not on file  Other Topics Concern   Not on file  Social History Narrative   Not on file   Social Drivers of Health   Financial Resource Strain: Not on file  Food Insecurity: No Food Insecurity (01/01/2023)   Hunger Vital Sign    Worried About Running Out of Food in the Last Year: Never true    Ran Out of Food in the Last Year: Never true  Transportation Needs: No Transportation Needs (01/01/2023)   PRAPARE - Scientist, Research (physical Sciences) (Medical): No    Lack of Transportation (Non-Medical): No  Physical Activity: Not on file  Stress: Not on file  Social Connections: Not on file  Intimate Partner Violence: Not At Risk (01/01/2023)   Humiliation, Afraid, Rape, and Kick questionnaire    Fear of Current or Ex-Partner: No    Emotionally Abused: No    Physically Abused: No    Sexually Abused: No    No family history on file.  Allergies as of 02/04/2023 - Review Complete 02/04/2023  Allergen Reaction Noted   Hydrocodone Anaphylaxis and Hives 07/22/2021   Tramadol Anaphylaxis 07/22/2021   Oxycodone Other (See Comments) 11/21/2012   Tramadol hcl Hives 01/01/2023   Codeine Rash and Hives 11/21/2012   Darvocet [propoxyphene n-acetaminophen ] Rash 11/21/2012   Eliquis  [apixaban ] Itching 12/24/2022    Current Outpatient Medications on File Prior to Encounter  Medication Sig Dispense Refill   acetaminophen  (TYLENOL ) 500 MG tablet Take 1,000 mg by mouth every 8 (eight) hours as needed for mild pain  or moderate pain.     atorvastatin (LIPITOR) 10 MG tablet Take 10 mg by mouth daily.     bisacodyl  (BISACODYL ) 5 MG EC tablet Take 10 mg by mouth daily as needed for moderate constipation.     DULoxetine (CYMBALTA) 60 MG capsule Take 60 mg by mouth daily.      ezetimibe  (ZETIA ) 10 MG tablet Take 10 mg by mouth daily.     furosemide  (LASIX ) 40 MG tablet Take 1 tablet (40 mg total) by mouth daily. 30 tablet 1   gabapentin  (NEURONTIN ) 600 MG tablet Take 600 mg by mouth 3 (three) times daily.     levocetirizine (XYZAL ) 5 MG tablet Take 5 mg by mouth daily.     LINZESS 145 MCG CAPS capsule Take 145 mcg by mouth daily.     metoprolol  succinate (TOPROL -XL) 50 MG 24 hr tablet Take 50 mg by mouth every morning. Take with or immediately following a meal.     omeprazole (PRILOSEC) 40 MG capsule Take 40 mg by mouth daily.     potassium chloride  SA (KLOR-CON  M) 20 MEQ tablet Take 2 tablets (40 mEq total) by mouth daily. (Patient  taking differently: Take 20 mEq by mouth daily.) 30 tablet 1   zolpidem  (AMBIEN ) 10 MG tablet Take 10 mg by mouth at bedtime.     fluticasone  (FLONASE ) 50 MCG/ACT nasal spray Place 1 spray into both nostrils daily.     glipiZIDE (GLUCOTROL) 5 MG tablet Take 5 mg by mouth daily before breakfast. (Patient not taking: Reported on 02/04/2023)     LORazepam  (ATIVAN ) 0.5 MG tablet Take 0.5 mg by mouth every 8 (eight) hours as needed for anxiety.     sucralfate  (CARAFATE ) 1 g tablet Take 1 g by mouth 4 (four) times daily.     No current facility-administered medications on file prior to encounter.   REVIEW OF SYSTEMS:  Review of Systems  Cardiovascular:  Negative for chest pain and palpitations.  Skin:  Negative for itching and rash.  Neurological:  Positive for tingling (pins and needles in lower extremity). Negative for dizziness.   PHYSICAL EXAMINATION:  Vitals:   02/04/23 0921  BP: 128/75  Pulse: 67  SpO2: 99%    Physical Exam Constitutional:      Appearance: Normal appearance.  Pulmonary:     Effort: Pulmonary effort is normal.  Neurological:     Mental Status: She is alert.  Psychiatric:        Mood and Affect: Mood normal.        Behavior: Behavior normal.        Thought Content: Thought content normal.   Villalta Score for Post-Thrombotic Syndrome: Pain: Moderate Cramps: Moderate Heaviness: Mild Paresthesia: Severe Pruritus: Moderate Pretibial Edema: Mild Skin Induration: Absent Hyperpigmentation: Absent Redness: Absent Venous Ectasia: Absent Pain on calf compression: Absent Villalta Preliminary Score: 11 Is venous ulcer present?: No If venous ulcer is present and score is <15, then 15 points total are assigned: Absent Villalta Total Score: 11  LABS:  CBC     Component Value Date/Time   WBC 6.3 01/02/2023 0434   RBC 4.06 01/02/2023 0434   HGB 11.1 (L) 01/02/2023 0434   HCT 35.6 (L) 01/02/2023 0434   PLT 329 01/02/2023 0434   MCV 87.7 01/02/2023 0434   MCH  27.3 01/02/2023 0434   MCHC 31.2 01/02/2023 0434   RDW 13.3 01/02/2023 0434   LYMPHSABS 1.9 01/01/2023 1221   MONOABS 0.4 01/01/2023 1221   EOSABS 0.3 01/01/2023  1221   BASOSABS 0.1 01/01/2023 1221    Hepatic Function      Component Value Date/Time   PROT 7.7 01/01/2023 1221   ALBUMIN 3.8 01/01/2023 1221   AST 17 01/01/2023 1221   ALT 14 01/01/2023 1221   ALKPHOS 96 01/01/2023 1221   BILITOT 0.3 01/01/2023 1221    Renal Function   Lab Results  Component Value Date   CREATININE 0.98 01/02/2023   CREATININE 0.95 01/01/2023   CREATININE 1.22 (H) 01/01/2023    CrCl cannot be calculated (Patient's most recent lab result is older than the maximum 21 days allowed.).   VVS Vascular Lab Studies:  04/02/22 venous doppler IMPRESSION: 1. Occlusive deep venous thrombosis of the left popliteal vein. The remaining left lower extremity venous structures are unremarkable. 2. No evidence of right lower extremity DVT.  06/30/22 venous doppler IMPRESSION: No evidence of deep venous thrombosis in either lower extremity. Interval resolution of left popliteal DVT.  ASSESSMENT: Location of DVT: Left popliteal vein Cause of DVT: unprovoked  Patient with history of multiple provoked DVTs in the past. 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 Self-discontinuation of Xarelto  due to cost presumably led to most-recent unprovoked DVT 04/02/22, reaffirming indication for indefinite anticoagulation as long as benefits outweigh risks. Her oncologist is in agreement with this plan.   Patient reports that her condition has improved since recent hospital discharge, though she continues to have some fatigue. Will plan to keep patient on Xarelto  moving forward as she reports that itching and dizziness have improved since stopping Eliquis , though symptoms were likely related to poor PO intake and electrolyte abnormalities which were managed  during hospitalization. Unclear what would have caused itching but this has resolved. Patient reports adequate adherence to Xarelto . She is wearing compression stockings. Has bilateral LEE associated with post-thrombotic syndrome. Continues to have severe nerve pain, described as pins and needles, which is being followed by PCP and spine specialist and not thought to be related to most recent DVT. We will send refills of Xarelto  today. Anticipate patient will need to pay $255 deductible, followed by $47/mo copay for the remainder of 2025, which is affordable for her. Encouraged patient to reach out if she has any cost issues going forward that would prohibit lifelong adherence to Xarelto .   PLAN: -Continue rivaroxaban  (Xarelto ) 20 mg daily with food. -Expected duration of therapy: Indefinite. Therapy started on 04/02/22. -Patient educated on purpose, proper use and potential adverse effects of rivaroxaban  (Xarelto ). -Discussed importance of taking medication around the same time every day. -Advised patient of medications to avoid (NSAIDs, aspirin doses >100 mg daily). -Educated that Tylenol  (acetaminophen ) is the preferred analgesic to lower the risk of bleeding. -Advised patient to alert all providers of anticoagulation therapy prior to starting a new medication or having a procedure. -Emphasized importance of monitoring for signs and symptoms of bleeding (abnormal bruising, prolonged bleeding, nose bleeds, bleeding from gums, discolored urine, black tarry stools). -Educated patient to present to the ED if emergent signs and symptoms of new thrombosis occur. -Counseled patient to wear compression stockings daily, removing at night. Elevate legs as needed to help with swelling.   Follow up: DVT clinic as needed  Lorain Baseman, PharmD PGY1 Pharmacy Resident  Lum Herald, PharmD, BCACP, CPP Deep Vein Thrombosis Clinic Clinical Pharmacist Practitioner Office: [phone]  I have evaluated the  patient's chart/imaging and refer this patient to the Clinical Pharmacist Practitioner for medication management. I have reviewed  the CPP's documentation and agree with her assessment and plan. I was immediately available during the visit for questions and collaboration.   Malvina New, MD

## 2023-02-04 ENCOUNTER — Ambulatory Visit (HOSPITAL_COMMUNITY)
Admission: RE | Admit: 2023-02-04 | Discharge: 2023-02-04 | Disposition: A | Discharge status: Home / Self Care | Payer: Medicare Other | Source: Ambulatory Visit | Attending: Surgery | Admitting: Surgery

## 2023-02-04 ENCOUNTER — Other Ambulatory Visit (HOSPITAL_COMMUNITY): Payer: Self-pay

## 2023-02-04 VITALS — BP 128/75 | HR 67

## 2023-02-04 DIAGNOSIS — I82432 Acute embolism and thrombosis of left popliteal vein: Secondary | ICD-10-CM

## 2023-02-04 MED ORDER — RIVAROXABAN 20 MG PO TABS: 20.0000 mg | ORAL_TABLET | Freq: Every day | ORAL | 11 refills | Status: DC

## 2023-02-04 NOTE — Patient Instructions (Addendum)
-  Continue rivaroxaban  (Xarelto ) 20 mg daily with food. -Your refills have been sent to [facility]. You may need to call the pharmacy to ask them to fill this when you start to run low on your current supply.  -It is important to take your medication around the same time every day.  -Avoid NSAIDs like ibuprofen  (Advil , Motrin ) and naproxen  (Aleve ) as well as aspirin doses over 100 mg daily. -Tylenol  (acetaminophen ) is the preferred over the counter pain medication to lower the risk of bleeding. -Be sure to alert all of your health care providers that you are taking an anticoagulant prior to starting a new medication or having a procedure. -Monitor for signs and symptoms of bleeding (abnormal bruising, prolonged bleeding, nose bleeds, bleeding from gums, discolored urine, black tarry stools). If you have fallen and hit your head OR if your bleeding is severe or not stopping, seek emergency care.  -Go to the emergency room if emergent signs and symptoms of new clot occur (new or worse swelling and pain in an arm or leg, shortness of breath, chest pain, fast or irregular heartbeats, lightheadedness, dizziness, fainting, coughing up blood) or if you experience a significant color change (pale or blue) in the extremity that has the DVT.  -We recommend you wear compression stockings as long as you are having swelling or pain. Be sure to purchase the correct size and take them off at night.   If you have any questions or need to reschedule an appointment, please call [phone] [facility]. We are changing locations on May 26, 2023. If it is after that time, our updated phone number will be on our website [email or web address] If you are having an emergency, call 911 or present to the nearest emergency room.   What is a DVT?  -Deep vein thrombosis (DVT) is a condition in which a blood clot forms in a vein of the deep venous system which can occur in the lower leg, thigh, pelvis, arm, or neck. This  condition is serious and can be life-threatening if the clot travels to the arteries of the lungs and causing a blockage (pulmonary embolism, PE). A DVT can also damage veins in the leg, which can lead to long-term venous disease, leg pain, swelling, discoloration, and ulcers or sores (post-thrombotic syndrome).  -Treatment may include taking an anticoagulant medication to prevent more clots from forming and the current clot from growing, wearing compression stockings, and/or surgical procedures to remove or dissolve the clot.

## 2023-03-12 ENCOUNTER — Telehealth (HOSPITAL_COMMUNITY): Payer: Self-pay | Admitting: Student-PharmD

## 2023-03-12 NOTE — Telephone Encounter (Signed)
Received called from Dr. Sherryll Burger at [facility] who received a clearance request for a surgery for this patient since she is on Xarelto. He is not currently prescribing her Xarelto, our office is, so he wanted to see if this is something we could manage. He does not have information on the specific surgery or when it is scheduled. I told him that he can provide the office planning the procedure with our fax number and we will provide a recommendation but will need more details about the procedure planned. He will pass this information along, and I will look out for any faxes regarding this.

## 2023-03-13 NOTE — Telephone Encounter (Signed)
Received fax from [facility] with request to hold Xarelto for 3 days for "procedure." Faxed back that we need details of specific procedure planned and when this is being considered for before we can provide a clearance recommendation.   [facility]:  Phone: [phone] Fax: [phone]

## 2023-03-18 NOTE — Telephone Encounter (Signed)
[facility] called back, spoke with Toniann Fail who clarified that they are requesting a 3 day hold for Xarelto prior to the spinal injection, which is not yet scheduled. She also confirmed that they do not handle Lovenox bridges and we would need to prescribe that if that's our recommendation.   Per Dr. Myra Gianotti, patient can hold Xarelto for 3 days prior to procedure but will require Lovenox bridge with last dose the morning on the day before the procedure due to high VTE risk. For example:   3 days prior to procedure: No Xarelto. Inject enoxaparin (Lovenox) 1 mg/kg in the fatty abdominal tissue at least 2 inches from the belly button twice a day about 12 hours apart, 8am and 8pm, rotate sites.  2 days prior to procedure: No Xarelto. Inject enoxaparin in the fatty tissue every 12 hours. 1 day prior to procedure: No Xarelto. Inject enoxaparin in the fatty tissue in the morning only (no PM dose).  Day of procedure: No enoxaparin. After the procedure, resume Xarelto as soon as safely possible per physician performing the procedure.   These instructions have been faxed to the office. They asked that we explain the instructions to the patient. When I called the patient, she said she had a gynecological or urological (she is not sure) procedure in December that we were not made aware of and that surgeon had her hold Xarelto for 3 days with no bridge. I explained that we could not safely recommend that and since we have been asked for clearance we would need her to bridge with Lovenox due to her high VTE risk (history of 4 incidences of VTE). She says that she sees her hematologist this week who she has followed with for VTE as well and she will ask their opinion as she may want the spine office to reach out to them for clearance instead.   If we are the ones managing the hold instructions, she will need to call us once the procedure is scheduled and provide [place] with an updated weight so we can send in her  Lovenox prescription. If she wants another office to manage the hold/clearance, then she and [facility] will need to follow that office's instruction. Patient confirms understanding and will call back with further needs. I have made [facility] aware of this as well. We will await further instruction at this time.

## 2023-03-18 NOTE — Telephone Encounter (Signed)
Received an updated fax with request to hold anticoagulation for 7 days prior to TFESI (spinal injection). Last request asked for 3 days. Called office for clarification on which duration is being requested. Awaiting call back.

## 2023-04-08 ENCOUNTER — Encounter (HOSPITAL_COMMUNITY): Payer: Self-pay | Admitting: Emergency Medicine

## 2023-04-08 ENCOUNTER — Emergency Department (HOSPITAL_COMMUNITY)

## 2023-04-08 ENCOUNTER — Other Ambulatory Visit: Payer: Self-pay

## 2023-04-08 ENCOUNTER — Emergency Department (HOSPITAL_COMMUNITY)
Admission: EM | Admit: 2023-04-08 | Discharge: 2023-04-08 | Disposition: A | Discharge status: Home / Self Care | Attending: Emergency Medicine | Admitting: Emergency Medicine

## 2023-04-08 DIAGNOSIS — Z7984 Long term (current) use of oral hypoglycemic drugs: Secondary | ICD-10-CM | POA: Insufficient documentation

## 2023-04-08 DIAGNOSIS — Z7951 Long term (current) use of inhaled steroids: Secondary | ICD-10-CM | POA: Insufficient documentation

## 2023-04-08 DIAGNOSIS — J40 Bronchitis, not specified as acute or chronic: Secondary | ICD-10-CM | POA: Insufficient documentation

## 2023-04-08 DIAGNOSIS — R059 Cough, unspecified: Secondary | ICD-10-CM | POA: Diagnosis present

## 2023-04-08 DIAGNOSIS — E119 Type 2 diabetes mellitus without complications: Secondary | ICD-10-CM | POA: Insufficient documentation

## 2023-04-08 DIAGNOSIS — Z7901 Long term (current) use of anticoagulants: Secondary | ICD-10-CM | POA: Insufficient documentation

## 2023-04-08 DIAGNOSIS — E785 Hyperlipidemia, unspecified: Secondary | ICD-10-CM | POA: Diagnosis not present

## 2023-04-08 LAB — RESP PANEL BY RT-PCR (RSV, FLU A&B, COVID)  RVPGX2
Influenza A by PCR: NEGATIVE
Influenza B by PCR: NEGATIVE
Resp Syncytial Virus by PCR: NEGATIVE
SARS Coronavirus 2 by RT PCR: NEGATIVE

## 2023-04-08 LAB — CBC
HCT: 36.1 % (ref 36.0–46.0)
Hemoglobin: 11.4 g/dL — ABNORMAL LOW (ref 12.0–15.0)
MCH: 27.8 pg (ref 26.0–34.0)
MCHC: 31.6 g/dL (ref 30.0–36.0)
MCV: 88 fL (ref 80.0–100.0)
Platelets: 346 10*3/uL (ref 150–400)
RBC: 4.1 MIL/uL (ref 3.87–5.11)
RDW: 14.2 % (ref 11.5–15.5)
WBC: 7.6 10*3/uL (ref 4.0–10.5)
nRBC: 0 % (ref 0.0–0.2)

## 2023-04-08 LAB — COMPREHENSIVE METABOLIC PANEL
ALT: 10 U/L (ref 0–44)
AST: 15 U/L (ref 15–41)
Albumin: 3.4 g/dL — ABNORMAL LOW (ref 3.5–5.0)
Alkaline Phosphatase: 91 U/L (ref 38–126)
Anion gap: 9 (ref 5–15)
BUN: 10 mg/dL (ref 8–23)
CO2: 24 mmol/L (ref 22–32)
Calcium: 8.8 mg/dL — ABNORMAL LOW (ref 8.9–10.3)
Chloride: 104 mmol/L (ref 98–111)
Creatinine, Ser: 1.16 mg/dL — ABNORMAL HIGH (ref 0.44–1.00)
GFR, Estimated: 51 mL/min — ABNORMAL LOW (ref >60)
Glucose, Bld: 133 mg/dL — ABNORMAL HIGH (ref 70–99)
Potassium: 3.6 mmol/L (ref 3.5–5.1)
Sodium: 137 mmol/L (ref 135–145)
Total Bilirubin: 0.6 mg/dL (ref 0.0–1.2)
Total Protein: 6.9 g/dL (ref 6.5–8.1)

## 2023-04-08 MED ORDER — IBUPROFEN 800 MG PO TABS
800.0000 mg | ORAL_TABLET | Freq: Once | ORAL | Status: AC
Start: 2023-04-08 — End: 2023-04-08
  Administered 2023-04-08: 800 mg via ORAL
  Filled 2023-04-08: qty 1

## 2023-04-08 MED ORDER — DOXYCYCLINE HYCLATE 100 MG PO CAPS
100.0000 mg | ORAL_CAPSULE | Freq: Two times a day (BID) | ORAL | 0 refills | Status: DC
Start: 2023-04-08 — End: 2023-12-10

## 2023-04-08 NOTE — ED Provider Notes (Signed)
 [place] EMERGENCY DEPARTMENT AT [facility] Provider Note   CSN: [id number] Arrival date & time: 04/08/23  2841     History {Add pertinent medical, surgical, social history, OB history to HPI:1} Chief Complaint  Patient presents with   flu like symptoms    Amanda Peterson is a 68 y.o. female.  Patient has a history of hyperlipidemia  and diabetes.  She complains of a cough   Cough      Home Medications Prior to Admission medications   Medication Sig Start Date End Date Taking? Authorizing Provider  acetaminophen (TYLENOL) 500 MG tablet Take 1,000 mg by mouth every 8 (eight) hours as needed for mild pain or moderate pain.   Yes [provider]  atorvastatin (LIPITOR) 10 MG tablet Take 10 mg by mouth daily.   Yes [provider]  bisacodyl (BISACODYL) 5 MG EC tablet Take 10 mg by mouth daily as needed for moderate constipation.   Yes [provider]  doxycycline (VIBRAMYCIN) 100 MG capsule Take 1 capsule (100 mg total) by mouth 2 (two) times daily. One po bid x 7 days 04/08/23  Yes Bethann Berkshire, MD  DULoxetine (CYMBALTA) 60 MG capsule Take 60 mg by mouth daily.    Yes [provider]  ezetimibe (ZETIA) 10 MG tablet Take 10 mg by mouth daily. 05/16/20  Yes [provider]  fluticasone (FLONASE) 50 MCG/ACT nasal spray Place 1 spray into both nostrils daily. 08/27/22  Yes [provider]  furosemide (LASIX) 40 MG tablet Take 1 tablet (40 mg total) by mouth daily. 01/02/23  Yes Vassie Loll, MD  gabapentin (NEURONTIN) 600 MG tablet Take 600 mg by mouth 3 (three) times daily. 06/18/21  Yes [provider]  glipiZIDE (GLUCOTROL) 5 MG tablet Take 5 mg by mouth daily before breakfast.   Yes [provider]  levocetirizine (XYZAL) 5 MG tablet Take 5 mg by mouth daily.   Yes [provider]  LINZESS 145 MCG CAPS capsule Take 145 mcg by mouth daily. 01/08/23  Yes [provider]  LORazepam  (ATIVAN) 0.5 MG tablet Take 0.5 mg by mouth every 8 (eight) hours as needed for anxiety. 11/19/22  Yes [provider]  metoprolol succinate (TOPROL-XL) 50 MG 24 hr tablet Take 50 mg by mouth every morning. Take with or immediately following a meal.   Yes [provider]  omeprazole (PRILOSEC) 40 MG capsule Take 40 mg by mouth daily. 01/08/23  Yes [provider]  potassium chloride SA (KLOR-CON M) 20 MEQ tablet Take 2 tablets (40 mEq total) by mouth daily. Patient taking differently: Take 20 mEq by mouth daily. 01/03/23  Yes Vassie Loll, MD  rivaroxaban (XARELTO) 20 MG TABS tablet Take 1 tablet (20 mg total) by mouth daily with supper. Take with food. 02/04/23  Yes Nada Libman, MD  zolpidem (AMBIEN) 10 MG tablet Take 10 mg by mouth at bedtime.   Yes [provider]      Allergies    Hydrocodone, Tramadol, Oxycodone, Tramadol hcl, Codeine, Darvocet [propoxyphene n-acetaminophen], and Eliquis [apixaban]    Review of Systems   Review of Systems  Respiratory:  Positive for cough.     Physical Exam Updated Vital Signs BP 108/60 (BP Location: Right Arm)   Pulse 78   Temp 99.3 F (37.4 C) (Oral)   Resp 19   Ht 5\' 5"  (1.651 m)   Wt 90.7 kg   SpO2 94%   BMI 33.28 kg/m  Physical Exam  ED Results / Procedures / Treatments   Labs (all labs ordered are listed, but only abnormal results are displayed) Labs Reviewed  CBC - Abnormal; Notable for the following components:      Result Value   Hemoglobin 11.4 (*)    All other components within normal limits  COMPREHENSIVE METABOLIC PANEL - Abnormal; Notable for the following components:   Glucose, Bld 133 (*)    Creatinine, Ser 1.16 (*)    Calcium 8.8 (*)    Albumin 3.4 (*)    GFR, Estimated 51 (*)    All other components within normal limits  RESP PANEL BY RT-PCR (RSV, FLU A&B, COVID)  RVPGX2    EKG None  Radiology DG Chest Portable 1 View Result Date: 04/08/2023 CLINICAL DATA:  Cough  EXAM: PORTABLE CHEST 1 VIEW COMPARISON:  01/01/2023 FINDINGS: The heart size and mediastinal contours are within normal limits. Minimal left basilar scarring or atelectasis. Lungs otherwise clear. The visualized skeletal structures are unremarkable. IMPRESSION: Minimal left basilar scarring or atelectasis. Electronically Signed   By: Duanne Guess D.O.   On: 04/08/2023 11:45    Procedures Procedures  {Document cardiac monitor, telemetry assessment procedure when appropriate:1}  Medications Ordered in ED Medications  ibuprofen (ADVIL) tablet 800 mg (800 mg Oral Given 04/08/23 1111)    ED Course/ Medical Decision Making/ A&P   {   Click here for ABCD2, HEART and other calculatorsREFRESH Note before signing :1}                              Medical Decision Making Amount and/or Complexity of Data Reviewed Labs: ordered. Radiology: ordered.  Risk Prescription drug management.   Patient with persistent cough.  He will be placed on doxycycline and follow-up with PCP  {Document critical care time when appropriate:1} {Document review of labs and clinical decision tools ie heart score, Chads2Vasc2 etc:1}  {Document your independent review of radiology images, and any outside records:1} {Document your discussion with family members, caretakers, and with consultants:1} {Document social determinants of health affecting pt's care:1} {Document your decision making why or why not admission, treatments were needed:1} Final Clinical Impression(s) / ED Diagnoses Final diagnoses:  Bronchitis    Rx / DC Orders ED Discharge Orders          Ordered    doxycycline (VIBRAMYCIN) 100 MG capsule  2 times daily        04/08/23 1453

## 2023-04-08 NOTE — Discharge Instructions (Signed)
 Drink plenty of fluid.  Rest.  Follow-up with your family doctor next week if not improving

## 2023-04-08 NOTE — ED Triage Notes (Signed)
 Pt c/o cough, chills, fatigue, weakness, loss of appetite, and headache since Saturday.

## 2023-04-15 ENCOUNTER — Other Ambulatory Visit: Payer: Self-pay | Admitting: Nurse Practitioner

## 2023-04-15 DIAGNOSIS — M5412 Radiculopathy, cervical region: Secondary | ICD-10-CM

## 2023-04-25 ENCOUNTER — Ambulatory Visit
Admission: RE | Admit: 2023-04-25 | Discharge: 2023-04-25 | Disposition: A | Discharge status: Home / Self Care | Source: Ambulatory Visit | Attending: Nurse Practitioner | Admitting: Nurse Practitioner

## 2023-04-25 DIAGNOSIS — M5412 Radiculopathy, cervical region: Secondary | ICD-10-CM

## 2023-05-04 NOTE — Progress Notes (Signed)
 Marland Kitchen

## 2023-06-05 ENCOUNTER — Encounter (HOSPITAL_COMMUNITY): Payer: Self-pay

## 2023-06-05 ENCOUNTER — Emergency Department (HOSPITAL_COMMUNITY)
Admission: EM | Admit: 2023-06-05 | Discharge: 2023-06-05 | Disposition: A | Discharge status: Home / Self Care | Attending: Emergency Medicine | Admitting: Emergency Medicine

## 2023-06-05 ENCOUNTER — Other Ambulatory Visit: Payer: Self-pay

## 2023-06-05 DIAGNOSIS — Z7901 Long term (current) use of anticoagulants: Secondary | ICD-10-CM | POA: Diagnosis not present

## 2023-06-05 DIAGNOSIS — G629 Polyneuropathy, unspecified: Secondary | ICD-10-CM | POA: Insufficient documentation

## 2023-06-05 HISTORY — DX: Polyneuropathy, unspecified: G62.9

## 2023-06-05 MED ORDER — AMITRIPTYLINE HCL 10 MG PO TABS: 10.0000 mg | ORAL_TABLET | Freq: Every day | ORAL | 0 refills | Status: DC

## 2023-06-05 NOTE — Discharge Instructions (Signed)
 You may try taking amitriptyline at night before you go to bed, take 1 tablet at night, this may help with your nerve pain, it is also used as a sleep aid so please do not take your sleeping pill at night.  You will need to follow-up with your family doctor for recheck this week

## 2023-06-05 NOTE — ED Triage Notes (Signed)
 Pt arrived via POV c/o bilateral neuropathy in both feet and reports pain shoots up her right side. Pt reports she has not had her gabapentin  for over a week due to her PCP advising her she needs to stop taking it before she can begin taking a different "nerve pain" medication. Pt reports her insurance will not cover this other medication though.

## 2023-06-05 NOTE — ED Provider Notes (Signed)
 [place] EMERGENCY DEPARTMENT AT [facility] Provider Note   CSN: [id number] Arrival date & time: 06/05/23  1428     History  Chief Complaint  Patient presents with   Peripheral Neuropathy    Amanda Peterson is a 68 y.o. female.  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  She is a borderline diabetic, she reports that she follows with her family doctor who recently took her off of gabapentin  about a week ago so that she can transition onto Lyrica but at this point her insurance company has not approved her to take the Lyrica.  She is working with her family doctor.  She states that the pain is worse in her feet at night, mostly on the right side, she has chronic edema of her legs, she is on Xarelto  for recurrent DVT.  She has no shortness of breath, no chest pain, no fevers or chills, she is in process of being referred to spinal doctors as well.  She has some neck pain which is mild but has been associated with MRI findings of spinal disease.  She denies weakness or numbness of the arms or the legs other than the neuropathy of her feet which has been going on for a long time.`  Home Medications Prior to Admission medications   Medication Sig Start Date End Date Taking? Authorizing Provider  amitriptyline (ELAVIL) 10 MG tablet Take 1 tablet (10 mg total) by mouth at bedtime. 06/05/23  Yes Early Glisson, MD  acetaminophen  (TYLENOL ) 500 MG tablet Take 1,000 mg by mouth every 8 (eight) hours as needed for mild pain or moderate pain.    [provider]  atorvastatin (LIPITOR) 10 MG tablet Take 10 mg by mouth daily.    [provider]  bisacodyl  (BISACODYL ) 5 MG EC tablet Take 10 mg by mouth daily as needed for moderate constipation.    [provider]  doxycycline  (VIBRAMYCIN ) 100 MG capsule Take 1  capsule (100 mg total) by mouth 2 (two) times daily. One po bid x 7 days 04/08/23   Zammit, Joseph, MD  DULoxetine (CYMBALTA) 60 MG capsule Take 60 mg by mouth daily.     [provider]  ezetimibe  (ZETIA ) 10 MG tablet Take 10 mg by mouth daily. 05/16/20   [provider]  fluticasone  (FLONASE ) 50 MCG/ACT nasal spray Place 1 spray into both nostrils daily. 08/27/22   [provider]  furosemide  (LASIX ) 40 MG tablet Take 1 tablet (40 mg total) by mouth daily. 01/02/23   Justina Oman, MD  gabapentin  (NEURONTIN ) 600 MG tablet Take 600 mg by mouth 3 (three) times daily. 06/18/21   [provider]  glipiZIDE (GLUCOTROL) 5 MG tablet Take 5 mg by mouth daily before breakfast.    [provider]  levocetirizine (XYZAL ) 5 MG tablet Take 5 mg by mouth daily.    [provider]  LINZESS 145 MCG CAPS capsule Take 145 mcg by mouth daily. 01/08/23   [provider]  LORazepam  (ATIVAN ) 0.5 MG tablet Take 0.5 mg by mouth every 8 (eight) hours as needed for anxiety. 11/19/22   [provider]  metoprolol  succinate (TOPROL -XL) 50 MG 24 hr tablet Take 50 mg by mouth every morning. Take with or immediately following a meal.    [provider]  omeprazole (PRILOSEC) 40 MG capsule Take 40 mg by mouth daily. 01/08/23   [provider]  potassium chloride  (KLOR-CON ) 10 MEQ tablet Take 10 mEq by mouth daily.    [provider]  potassium chloride  SA (KLOR-CON  M) 20 MEQ tablet Take 2 tablets (40 mEq total) by mouth daily. Patient taking differently: Take 20 mEq by mouth daily. 01/03/23   Justina Oman, MD  rivaroxaban  (XARELTO ) 20 MG TABS tablet Take 1 tablet (20 mg total) by mouth daily with supper. Take with food. Patient taking differently: Take 10 mg by mouth daily with supper. Take with food. 02/04/23   Margherita Shell, MD  zolpidem  (AMBIEN ) 10 MG tablet Take 10 mg by mouth at bedtime.    [provider]       Allergies    Hydrocodone, Tramadol, Oxycodone, Codeine, Darvocet [propoxyphene n-acetaminophen ], and Eliquis  Elias.Earnest ]    Review of Systems   Review of Systems  All other systems reviewed and are negative.   Physical Exam Updated Vital Signs BP (!) 147/74 (BP Location: Right Arm)   Pulse 62   Temp (!) 97.4 F (36.3 C) (Temporal)   Resp 18   Ht 1.651 m (5\' 5" )   Wt 90.7 kg   SpO2 100%   BMI 33.27 kg/m  Physical Exam Vitals and nursing note reviewed.  Constitutional:      General: She is not in acute distress.    Appearance: She is well-developed.  HENT:     Head: Normocephalic and atraumatic.     Mouth/Throat:     Pharynx: No oropharyngeal exudate.  Eyes:     General: No scleral icterus.       Right eye: No discharge.        Left eye: No discharge.     Conjunctiva/sclera: Conjunctivae normal.     Pupils: Pupils are equal, round, and reactive to light.  Neck:     Thyroid : No thyromegaly.     Vascular: No JVD.  Cardiovascular:     Rate and Rhythm: Normal rate and regular rhythm.     Heart sounds: Normal heart sounds. No murmur heard.    No friction rub. No gallop.  Pulmonary:     Effort: Pulmonary effort is normal. No respiratory distress.     Breath sounds: Normal breath sounds. No wheezing or rales.  Abdominal:     General: Bowel sounds are normal. There is no distension.     Palpations: Abdomen is soft. There is no mass.     Tenderness: There is no abdominal tenderness.  Musculoskeletal:        General: No tenderness. Normal range of motion.     Cervical back: Normal range of motion and neck supple.     Right lower leg: Edema present.     Left lower leg: Edema present.     Comments: 1+ edema bilaterally at the ankles  Lymphadenopathy:     Cervical: No cervical adenopathy.  Skin:    General: Skin is warm and dry.     Findings: No erythema or rash.  Neurological:     Mental Status: She is alert.     Coordination: Coordination normal.     Comments:  Paresthesias to the bilateral feet with hypersensitivity, no redness, pulses are good, capillary refill is good  Psychiatric:        Behavior: Behavior normal.     ED Results / Procedures / Treatments   Labs (all labs ordered are listed, but only abnormal results  are displayed) Labs Reviewed - No data to display  EKG None  Radiology No results found.  Procedures Procedures    Medications Ordered in ED Medications - No data to display  ED Course/ Medical Decision Making/ A&P                                 Medical Decision Making Risk Prescription drug management.   Patient is in no distress, vital signs are unremarkable, not tachycardic hypotensive or febrile.  I suspect that she has peripheral neuropathy which is chronic and unfortunately in illness that will not quickly go away.  We can switch her over to something like amitriptyline to see if that helps before bed, she was counseled not to use her other sleep aids at night and to follow-up with her family doctor.  I do not think she needs any other stabilizing care at this time.  The patient is agreeable to the plan, med sent to the pharmacy.        Final Clinical Impression(s) / ED Diagnoses Final diagnoses:  Neuropathy    Rx / DC Orders ED Discharge Orders          Ordered    amitriptyline (ELAVIL) 10 MG tablet  Daily at bedtime        06/05/23 1701              Early Glisson, MD 06/05/23 1704

## 2023-08-12 ENCOUNTER — Other Ambulatory Visit (HOSPITAL_COMMUNITY): Payer: Self-pay | Admitting: Registered Nurse

## 2023-08-12 ENCOUNTER — Ambulatory Visit
Admission: RE | Admit: 2023-08-12 | Discharge: 2023-08-12 | Disposition: A | Discharge status: Home / Self Care | Source: Ambulatory Visit | Attending: Registered Nurse | Admitting: Registered Nurse

## 2023-08-12 DIAGNOSIS — M79604 Pain in right leg: Secondary | ICD-10-CM

## 2023-08-12 DIAGNOSIS — M25551 Pain in right hip: Secondary | ICD-10-CM

## 2023-08-12 DIAGNOSIS — M25571 Pain in right ankle and joints of right foot: Secondary | ICD-10-CM

## 2023-08-12 DIAGNOSIS — M25561 Pain in right knee: Secondary | ICD-10-CM

## 2023-08-21 ENCOUNTER — Emergency Department (HOSPITAL_COMMUNITY)

## 2023-08-21 ENCOUNTER — Emergency Department (HOSPITAL_COMMUNITY)
Admission: EM | Admit: 2023-08-21 | Discharge: 2023-08-22 | Disposition: A | Discharge status: Home / Self Care | Attending: Emergency Medicine | Admitting: Emergency Medicine

## 2023-08-21 ENCOUNTER — Other Ambulatory Visit: Payer: Self-pay

## 2023-08-21 ENCOUNTER — Encounter (HOSPITAL_COMMUNITY): Payer: Self-pay

## 2023-08-21 DIAGNOSIS — R42 Dizziness and giddiness: Secondary | ICD-10-CM | POA: Diagnosis present

## 2023-08-21 DIAGNOSIS — I10 Essential (primary) hypertension: Secondary | ICD-10-CM | POA: Insufficient documentation

## 2023-08-21 DIAGNOSIS — Z79899 Other long term (current) drug therapy: Secondary | ICD-10-CM | POA: Insufficient documentation

## 2023-08-21 DIAGNOSIS — Z7901 Long term (current) use of anticoagulants: Secondary | ICD-10-CM | POA: Insufficient documentation

## 2023-08-21 DIAGNOSIS — R001 Bradycardia, unspecified: Secondary | ICD-10-CM | POA: Diagnosis not present

## 2023-08-21 LAB — COMPREHENSIVE METABOLIC PANEL WITH GFR
ALT: 12 U/L (ref 0–44)
AST: 14 U/L — ABNORMAL LOW (ref 15–41)
Albumin: 3.4 g/dL — ABNORMAL LOW (ref 3.5–5.0)
Alkaline Phosphatase: 93 U/L (ref 38–126)
Anion gap: 8 (ref 5–15)
BUN: 8 mg/dL (ref 8–23)
CO2: 22 mmol/L (ref 22–32)
Calcium: 9 mg/dL (ref 8.9–10.3)
Chloride: 109 mmol/L (ref 98–111)
Creatinine, Ser: 1.03 mg/dL — ABNORMAL HIGH (ref 0.44–1.00)
GFR, Estimated: 59 mL/min — ABNORMAL LOW (ref >60)
Glucose, Bld: 109 mg/dL — ABNORMAL HIGH (ref 70–99)
Potassium: 3.8 mmol/L (ref 3.5–5.1)
Sodium: 139 mmol/L (ref 135–145)
Total Bilirubin: 0.5 mg/dL (ref 0.0–1.2)
Total Protein: 6.8 g/dL (ref 6.5–8.1)

## 2023-08-21 LAB — I-STAT CHEM 8, ED
BUN: 8 mg/dL (ref 8–23)
Calcium, Ion: 1.17 mmol/L (ref 1.15–1.40)
Chloride: 107 mmol/L (ref 98–111)
Creatinine, Ser: 1 mg/dL (ref 0.44–1.00)
Glucose, Bld: 103 mg/dL — ABNORMAL HIGH (ref 70–99)
HCT: 36 % (ref 36.0–46.0)
Hemoglobin: 12.2 g/dL (ref 12.0–15.0)
Potassium: 3.8 mmol/L (ref 3.5–5.1)
Sodium: 141 mmol/L (ref 135–145)
TCO2: 22 mmol/L (ref 22–32)

## 2023-08-21 LAB — CBC
HCT: 36.7 % (ref 36.0–46.0)
Hemoglobin: 11.6 g/dL — ABNORMAL LOW (ref 12.0–15.0)
MCH: 27.7 pg (ref 26.0–34.0)
MCHC: 31.6 g/dL (ref 30.0–36.0)
MCV: 87.6 fL (ref 80.0–100.0)
Platelets: 323 K/uL (ref 150–400)
RBC: 4.19 MIL/uL (ref 3.87–5.11)
RDW: 14.4 % (ref 11.5–15.5)
WBC: 5.9 K/uL (ref 4.0–10.5)
nRBC: 0 % (ref 0.0–0.2)

## 2023-08-21 LAB — URINALYSIS, ROUTINE W REFLEX MICROSCOPIC
Bilirubin Urine: NEGATIVE
Glucose, UA: NEGATIVE mg/dL
Hgb urine dipstick: NEGATIVE
Ketones, ur: NEGATIVE mg/dL
Leukocytes,Ua: NEGATIVE
Nitrite: NEGATIVE
Protein, ur: NEGATIVE mg/dL
Specific Gravity, Urine: 1.01 (ref 1.005–1.030)
pH: 5 (ref 5.0–8.0)

## 2023-08-21 LAB — DIFFERENTIAL
Abs Immature Granulocytes: 0.02 K/uL (ref 0.00–0.07)
Basophils Absolute: 0 K/uL (ref 0.0–0.1)
Basophils Relative: 1 %
Eosinophils Absolute: 0.3 K/uL (ref 0.0–0.5)
Eosinophils Relative: 5 %
Immature Granulocytes: 0 %
Lymphocytes Relative: 31 %
Lymphs Abs: 1.8 K/uL (ref 0.7–4.0)
Monocytes Absolute: 0.3 K/uL (ref 0.1–1.0)
Monocytes Relative: 5 %
Neutro Abs: 3.4 K/uL (ref 1.7–7.7)
Neutrophils Relative %: 58 %

## 2023-08-21 LAB — TROPONIN I (HIGH SENSITIVITY)
Troponin I (High Sensitivity): 6 ng/L (ref <18)
Troponin I (High Sensitivity): 8 ng/L (ref <18)

## 2023-08-21 LAB — ETHANOL: Alcohol, Ethyl (B): <15 mg/dL (ref <15)

## 2023-08-21 LAB — PROTIME-INR
INR: 1.1 (ref 0.8–1.2)
Prothrombin Time: 15.3 s — ABNORMAL HIGH (ref 11.4–15.2)

## 2023-08-21 LAB — APTT: aPTT: 34 s (ref 24–36)

## 2023-08-21 MED ORDER — ONDANSETRON HCL 4 MG PO TABS: 4.0000 mg | ORAL_TABLET | Freq: Four times a day (QID) | ORAL | 0 refills | Status: DC

## 2023-08-21 MED ORDER — LACTATED RINGERS IV BOLUS
1000.0000 mL | Freq: Once | INTRAVENOUS | Status: AC
  Administered 2023-08-21: 1000 mL via INTRAVENOUS

## 2023-08-21 MED ORDER — LORAZEPAM 2 MG/ML IJ SOLN
0.5000 mg | Freq: Once | INTRAMUSCULAR | Status: AC | PRN
  Administered 2023-08-21: 0.5 mg via INTRAVENOUS
  Filled 2023-08-21: qty 1

## 2023-08-21 MED ORDER — MECLIZINE HCL 25 MG PO TABS: 25.0000 mg | ORAL_TABLET | Freq: Three times a day (TID) | ORAL | 0 refills | Status: AC | PRN

## 2023-08-21 MED ORDER — ONDANSETRON HCL 4 MG/2ML IJ SOLN
4.0000 mg | Freq: Once | INTRAMUSCULAR | Status: AC
  Administered 2023-08-21: 4 mg via INTRAVENOUS
  Filled 2023-08-21: qty 2

## 2023-08-21 MED ORDER — MECLIZINE HCL 25 MG PO TABS
25.0000 mg | ORAL_TABLET | Freq: Once | ORAL | Status: AC
  Administered 2023-08-21: 25 mg via ORAL
  Filled 2023-08-21: qty 1

## 2023-08-21 NOTE — Discharge Instructions (Addendum)
 You are seen today for vertigo.  Your imaging and lab work today were reassuring that have low suspicion for any emergent cause of your symptoms today.  I am prescribing you meclizine  for the vertigo you are experiencing today.  Please do not take with your potassium supplements, taking 1 to beginning and 1 at the end of the day as needed.  I have also prescribed a nausea medication for you to take as needed for nausea every 6 hours.  Please follow-up with your PCP for persistent symptoms.  But return to the ED sooner if you into any new or worsening symptoms which include fever, uncontrolled vomiting, chest pain, shortness of breath, confusion.

## 2023-08-21 NOTE — ED Notes (Signed)
 CCMD called, pt on monitor

## 2023-08-21 NOTE — ED Provider Notes (Incomplete)
 [place] EMERGENCY DEPARTMENT AT [facility] Provider Note   CSN: [id number] Arrival date & time: 08/21/23  1154     Patient presents with: Dizziness   Amanda Peterson is a 68 y.o. female.  Dizziness Patient is a 68 year old female to the ED today with concerns for vertigo that started yesterday morning when she awoke, worse with sitting up and has been accompanied with nausea saying that she has had 2 episodes of vomiting over the course of the last 2 days.  .  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  States that she has had decreased appetite having only had a few sips of ginger ale and water which she is able to tolerate without vomiting.  Seen by provider this morning and told come to the ED due to bradycardia with an HR of 57 BPM and associated dizziness.  Now states that she has anterior headache.  Denies blurry vision, tinnitus, hearing loss, dysphagia, one-sided weakness, odynophagia, cough, congestion, shortness of breath, chest pain, abdominal pain, diarrhea, hematochezia, melena, lower leg swelling.     Prior to Admission medications   Medication Sig Start Date End Date Taking? Authorizing Provider  acetaminophen  (TYLENOL ) 500 MG tablet Take 1,000 mg by mouth every 8 (eight) hours as needed for mild pain or moderate pain.    [provider]  amitriptyline  (ELAVIL ) 10 MG tablet Take 1 tablet (10 mg total) by mouth at bedtime. 06/05/23   Cleotilde Rogue, MD  atorvastatin (LIPITOR) 10 MG tablet Take 10 mg by mouth daily.    [provider]  bisacodyl  (BISACODYL ) 5 MG EC tablet Take 10 mg by mouth daily as needed for moderate constipation.    [provider]  doxycycline  (VIBRAMYCIN ) 100 MG capsule Take 1 capsule (100 mg total) by mouth 2 (two) times daily. One po bid x 7 days 04/08/23   Zammit, Joseph, MD  DULoxetine (CYMBALTA) 60 MG  capsule Take 60 mg by mouth daily.     [provider]  ezetimibe  (ZETIA ) 10 MG tablet Take 10 mg by mouth daily. 05/16/20   [provider]  fluticasone  (FLONASE ) 50 MCG/ACT nasal spray Place 1 spray into both nostrils daily. 08/27/22   [provider]  furosemide  (LASIX ) 40 MG tablet Take 1 tablet (40 mg total) by mouth daily. 01/02/23   Ricky Fines, MD  gabapentin  (NEURONTIN ) 600 MG tablet Take 600 mg by mouth 3 (three) times daily. 06/18/21   [provider]  glipiZIDE (GLUCOTROL) 5 MG tablet Take 5 mg by mouth daily before breakfast.    [provider]  levocetirizine (XYZAL ) 5 MG tablet Take 5 mg by mouth daily.    [provider]  LINZESS 145 MCG CAPS capsule Take 145 mcg by mouth daily. 01/08/23   [provider]  LORazepam  (ATIVAN ) 0.5 MG tablet Take 0.5 mg by mouth every 8 (eight) hours as needed for anxiety. 11/19/22   [provider]  metoprolol  succinate (TOPROL -XL) 50 MG 24 hr tablet Take 50 mg by mouth every morning. Take with or immediately following a meal.    [provider]  omeprazole (PRILOSEC) 40 MG capsule Take 40 mg by mouth daily. 01/08/23   [provider]  potassium chloride  (KLOR-CON ) 10 MEQ tablet Take 10 mEq by mouth daily.    [provider]  potassium chloride  SA (KLOR-CON  M) 20 MEQ tablet Take 2 tablets (  40 mEq total) by mouth daily. Patient taking differently: Take 20 mEq by mouth daily. 01/03/23   Ricky Fines, MD  rivaroxaban  (XARELTO ) 20 MG TABS tablet Take 1 tablet (20 mg total) by mouth daily with supper. Take with food. Patient taking differently: Take 10 mg by mouth daily with supper. Take with food. 02/04/23   Serene Gaile ORN, MD  zolpidem  (AMBIEN ) 10 MG tablet Take 10 mg by mouth at bedtime.    [provider]    Allergies: Hydrocodone, Tramadol, Oxycodone, Codeine, Darvocet [propoxyphene n-acetaminophen ], and Eliquis  [apixaban ]    Review of  Systems  Neurological:  Positive for dizziness.  All other systems reviewed and are negative.   Updated Vital Signs BP 124/85   Pulse (!) 55   Temp 98 F (36.7 C)   Resp (!) 21   SpO2 100%   Physical Exam Vitals and nursing note reviewed.  Constitutional:      General: She is not in acute distress.    Appearance: Normal appearance. She is not ill-appearing or diaphoretic.  HENT:     Head: Normocephalic and atraumatic.  Eyes:     General: No scleral icterus.       Right eye: No discharge.        Left eye: No discharge.     Extraocular Movements: Extraocular movements intact.     Conjunctiva/sclera: Conjunctivae normal.     Pupils: Pupils are equal, round, and reactive to light.  Cardiovascular:     Rate and Rhythm: Regular rhythm. Bradycardia present.     Pulses: Normal pulses.     Heart sounds: Normal heart sounds. No murmur heard.    No friction rub. No gallop.     Comments: Mild bradycardia noted with HR of 54 bpm to 60 bpm. Pulmonary:     Effort: Pulmonary effort is normal. No respiratory distress.     Breath sounds: Normal breath sounds. No stridor. No wheezing, rhonchi or rales.  Chest:     Chest wall: No tenderness.  Abdominal:     General: Abdomen is flat. There is no distension.     Palpations: Abdomen is soft.     Tenderness: There is no abdominal tenderness. There is no right CVA tenderness, left CVA tenderness or guarding.  Musculoskeletal:     Cervical back: Normal range of motion and neck supple. No rigidity or tenderness.     Right lower leg: No edema.     Left lower leg: No edema.  Skin:    General: Skin is warm and dry.     Findings: No bruising, erythema or lesion.  Neurological:     General: No focal deficit present.     Mental Status: She is alert and oriented to person, place, and time. Mental status is at baseline.     Sensory: No sensory deficit.     Motor: No weakness.     Comments: No facial asymmetry, no ataxia, no apraxia, no aphasia, no  arm drift, normal coordination with finger-to-nose, normal sensation to both upper and lower extremities bilaterally, normal grip strength bilaterally, normal strength to both flexion and extension to both upper lower extremities 5+ bilaterally, no visual field deficits, no nystagmus.   Psychiatric:        Mood and Affect: Mood normal.     (all labs ordered are listed, but only abnormal results are displayed) Labs Reviewed  PROTIME-INR - Abnormal; Notable for the following components:      Result Value   Prothrombin Time  15.3 (*)    All other components within normal limits  CBC - Abnormal; Notable for the following components:   Hemoglobin 11.6 (*)    All other components within normal limits  COMPREHENSIVE METABOLIC PANEL WITH GFR - Abnormal; Notable for the following components:   Glucose, Bld 109 (*)    Creatinine, Ser 1.03 (*)    Albumin 3.4 (*)    AST 14 (*)    GFR, Estimated 59 (*)    All other components within normal limits  I-STAT CHEM 8, ED - Abnormal; Notable for the following components:   Glucose, Bld 103 (*)    All other components within normal limits  APTT  DIFFERENTIAL  ETHANOL  CBG MONITORING, ED    EKG: EKG Interpretation Date/Time:  Thursday August 21 2023 12:02:53 EDT Ventricular Rate:  58 PR Interval:  150 QRS Duration:  74 QT Interval:  458 QTC Calculation: 449 R Axis:   17  Text Interpretation: Sinus bradycardia with sinus arrhythmia Anterior infarct , age undetermined Abnormal ECG When compared with ECG of 01-Jan-2023 13:39, No significant change since last tracing Confirmed by Francesca Fallow ([id number]) on 08/21/2023 3:18:30 PM  Radiology: CT HEAD WO CONTRAST Result Date: 08/21/2023 CLINICAL DATA:  Dizziness EXAM: CT HEAD WITHOUT CONTRAST TECHNIQUE: Contiguous axial images were obtained from the base of the skull through the vertex without intravenous contrast. RADIATION DOSE REDUCTION: This exam was performed according to the departmental  dose-optimization program which includes automated exposure control, adjustment of the mA and/or kV according to patient size and/or use of iterative reconstruction technique. COMPARISON:  CT head 01/01/2023 FINDINGS: Brain: No acute intracranial hemorrhage. No CT evidence of acute infarct. No edema, mass effect, or midline shift. The basilar cisterns are patent. Ventricles: The ventricles are normal. Vascular: Similar atherosclerotic calcifications in the carotid siphons. No hyperdense vessel. Skull: No acute or aggressive finding. Orbits: Orbits are symmetric. Sinuses: Mild mucosal thickening in the ethmoid sinuses. Other: Mastoid air cells are clear. IMPRESSION: No CT evidence of acute intracranial abnormality. Electronically Signed   By: Donnice Mania M.D.   On: 08/21/2023 13:03    Procedures   Medications Ordered in the ED - No data to display                              Medical Decision Making Amount and/or Complexity of Data Reviewed Labs: ordered. Radiology: ordered.  Risk Prescription drug management.   This patient is a 68 year old female who presents to the ED for concern of acute vertigo that started 2 days ago and has been unrelenting, worse with repositioning of the head.  Accompanied with 2 episodes of vomiting as well as persistent nausea and frontal headache that she describes as mild.  On physical exam, patient is in no acute distress, afebrile, alert and orient x 4, speaking in full sentences, nontachypneic.  Noted to have a borderline bradycardia with heart rate of 54 to 60 bpm.  Normal neuroexam.  LCTAB, no murmur, no lower leg edema.  No abdominal tenderness palpation, no CVA tenderness.  Unremarkable exam otherwise.  Initial workup was ordered in triage, with no acute changes noted on her labs.  CT scan was negative.  However with patient experiencing persistent vertigo, will provide meclizine  as well as fluids for suspected dehydration with her noting that she has not  drinking which she normally has.  However with symptoms, we will order MRI to rule out occult CVA.  MRI/MRA is negative for any acute findings.  Low suspicion for any central causes of vertigo at this time.  Initial meclizine  did help however upon ambulation which was approximately 6 hours after initial dose, patient was still experiencing some unilateral vertigo, furthering suspicion for peripheral vertigo.  Provided second dose of meclizine  and had her walk again with improvement.  Low suspicion for any arrhythmias or cardiac causes of patient's vertigo today.  Will have her continue to follow-up with PCP as well as ENT for further evaluation.  Will send home with meclizine .  Patient vital signs have remained stable throughout the course of patient's time in the ED. Low suspicion for any other emergent pathology at this time. I believe this patient is safe to be discharged. Provided strict return to ER precautions. Patient expressed agreement and understanding of plan. All questions were answered.  Differential diagnoses prior to evaluation: The emergent differential diagnosis includes, but is not limited to, tension headache, migraine, polypharmacy, substance abuse, sinusitis, cervicogenic headache, dehydration, cluster headache, trigeminal neuralgia, IIH, PRES syndrome, intracranial bleed, CVA, labyrinthitis, BPPV, Mnire's disease. This is not an exhaustive differential.   Past Medical History / Co-morbidities / Social History: HTN, anxiety, neuropathy, chronic neck pain, DVT  Additional history: Chart reviewed. Pertinent results include:  Seen on 08/04/2023 for cervical disc disease.  Noted to have been reporting chronic right-sided pain which is chronic.  Lab Tests/Imaging studies: I personally interpreted labs/imaging and the pertinent results include:   CBC unremarkable  CMP stable GFR and Cr Troponin and delta unremarkable APTT unremarkable PT/INR shows elevated PT of 15.3 UA  unremarkable I-STAT Chem-8 unremarkable Ethanol unremarkable CT head unremarkable MRI brain shows no acute processes I agree with the radiologist interpretation.  Cardiac monitoring: EKG obtained and interpreted by myself and attending physician which shows: sinus bradycardia with sinus arrythmia  EKG Interpretation Date/Time:  Thursday August 21 2023 12:02:53 EDT Ventricular Rate:  58 PR Interval:  150 QRS Duration:  74 QT Interval:  458 QTC Calculation: 449 R Axis:   17  Text Interpretation: Sinus bradycardia with sinus arrhythmia Anterior infarct , age undetermined Abnormal ECG When compared with ECG of 01-Jan-2023 13:39, No significant change since last tracing Confirmed by Francesca Fallow ([id number]) on 08/21/2023 3:18:30 PM      ]    Medications: I ordered medication including meclizine .  I have reviewed the patients home medicines and have made adjustments as needed.  Critical Interventions:  Social Determinants of Health:  Disposition: After consideration of the diagnostic results and the patients response to treatment, I feel that the patient would benefit from discharge home as above.   emergency department workup does not suggest an emergent condition requiring admission or immediate intervention beyond what has been performed at this time. The plan is: ***. The patient is safe for discharge and has been instructed to return immediately for worsening symptoms, change in symptoms or any other concerns.    Final diagnoses:  None    ED Discharge Orders     None

## 2023-08-21 NOTE — ED Triage Notes (Signed)
 PT states she was sent from [facility] due to dizziness and bradycardia. Pt reports HR 54.

## 2023-08-21 NOTE — ED Triage Notes (Signed)
 Patient c/o dizziness since waking up on Wednesday morning. Patient states her head has been spinning since then, she has had to hold on to the wall, almost fallen twice, thrown up once. Patient is sinus brady, NIH is 0 in triage, denies pain.

## 2023-08-21 NOTE — ED Notes (Signed)
 Patient transported to MRI

## 2023-08-21 NOTE — ED Provider Notes (Signed)
 [place] EMERGENCY DEPARTMENT AT [facility] Provider Note   CSN: [id number] Arrival date & time: 08/21/23  1154     Patient presents with: Dizziness   Amanda Peterson is a 68 y.o. female.  Dizziness Patient is a 68 year old female to the ED today with concerns for vertigo that started yesterday morning when she awoke, worse with sitting up and has been accompanied with nausea saying that she has had 2 episodes of vomiting over the course of the last 2 days.  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  States that she has had decreased appetite having only had a few sips of ginger ale and water which she is able to tolerate without vomiting.  Seen by provider this morning and told come to the ED due to bradycardia with an HR of 57 BPM and associated dizziness.  Now states that she has anterior headache.  Denies blurry vision, tinnitus, hearing loss, dysphagia, one-sided weakness, odynophagia, cough, congestion, shortness of breath, chest pain, abdominal pain, diarrhea, hematochezia, melena, lower leg swelling.     Prior to Admission medications   Medication Sig Start Date End Date Taking? Authorizing Provider  meclizine  (ANTIVERT ) 25 MG tablet Take 1 tablet (25 mg total) by mouth 3 (three) times daily as needed for dizziness. 08/21/23  Yes Jermain Curt S, PA-C  ondansetron  (ZOFRAN ) 4 MG tablet Take 1 tablet (4 mg total) by mouth every 6 (six) hours. 08/21/23  Yes Charlette Hennings S, PA-C  acetaminophen  (TYLENOL ) 500 MG tablet Take 1,000 mg by mouth every 8 (eight) hours as needed for mild pain or moderate pain.    [provider]  amitriptyline  (ELAVIL ) 10 MG tablet Take 1 tablet (10 mg total) by mouth at bedtime. 06/05/23   Cleotilde Rogue, MD  atorvastatin (LIPITOR) 10 MG tablet Take 10 mg by mouth daily.    [provider]  bisacodyl  (BISACODYL ) 5 MG EC  tablet Take 10 mg by mouth daily as needed for moderate constipation.    [provider]  doxycycline  (VIBRAMYCIN ) 100 MG capsule Take 1 capsule (100 mg total) by mouth 2 (two) times daily. One po bid x 7 days 04/08/23   Zammit, Joseph, MD  DULoxetine (CYMBALTA) 60 MG capsule Take 60 mg by mouth daily.     [provider]  ezetimibe  (ZETIA ) 10 MG tablet Take 10 mg by mouth daily. 05/16/20   [provider]  fluticasone  (FLONASE ) 50 MCG/ACT nasal spray Place 1 spray into both nostrils daily. 08/27/22   [provider]  furosemide  (LASIX ) 40 MG tablet Take 1 tablet (40 mg total) by mouth daily. 01/02/23   Ricky Fines, MD  gabapentin  (NEURONTIN ) 600 MG tablet Take 600 mg by mouth 3 (three) times daily. 06/18/21   [provider]  glipiZIDE (GLUCOTROL) 5 MG tablet Take 5 mg by mouth daily before breakfast.    [provider]  levocetirizine (XYZAL ) 5 MG tablet Take 5 mg by mouth daily.    [provider]  LINZESS 145 MCG CAPS capsule Take 145 mcg by mouth daily. 01/08/23   [provider]  LORazepam  (ATIVAN ) 0.5 MG tablet Take 0.5 mg by mouth every 8 (eight) hours as needed for anxiety. 11/19/22   [provider]  metoprolol  succinate (TOPROL -XL) 50 MG 24 hr tablet Take 50 mg by mouth every morning. Take with or immediately following a meal.  [provider]  omeprazole (PRILOSEC) 40 MG capsule Take 40 mg by mouth daily. 01/08/23   [provider]  potassium chloride  (KLOR-CON ) 10 MEQ tablet Take 10 mEq by mouth daily.    [provider]  potassium chloride  SA (KLOR-CON  M) 20 MEQ tablet Take 2 tablets (40 mEq total) by mouth daily. Patient taking differently: Take 20 mEq by mouth daily. 01/03/23   Ricky Fines, MD  rivaroxaban  (XARELTO ) 20 MG TABS tablet Take 1 tablet (20 mg total) by mouth daily with supper. Take with food. Patient taking differently: Take 10 mg by mouth daily with supper. Take  with food. 02/04/23   Serene Gaile ORN, MD  zolpidem  (AMBIEN ) 10 MG tablet Take 10 mg by mouth at bedtime.    [provider]    Allergies: Hydrocodone, Tramadol, Oxycodone, Codeine, Darvocet [propoxyphene n-acetaminophen ], and Eliquis  [apixaban ]    Review of Systems  Neurological:  Positive for dizziness.  All other systems reviewed and are negative.   Updated Vital Signs BP 138/82   Pulse (!) 54   Temp 97.8 F (36.6 C)   Resp (!) 22   SpO2 100%   Physical Exam Vitals and nursing note reviewed.  Constitutional:      General: She is not in acute distress.    Appearance: Normal appearance. She is not ill-appearing or diaphoretic.  HENT:     Head: Normocephalic and atraumatic.  Eyes:     General: No scleral icterus.       Right eye: No discharge.        Left eye: No discharge.     Extraocular Movements: Extraocular movements intact.     Conjunctiva/sclera: Conjunctivae normal.     Pupils: Pupils are equal, round, and reactive to light.  Cardiovascular:     Rate and Rhythm: Regular rhythm. Bradycardia present.     Pulses: Normal pulses.     Heart sounds: Normal heart sounds. No murmur heard.    No friction rub. No gallop.     Comments: Mild bradycardia noted with HR of 54 bpm to 60 bpm. Pulmonary:     Effort: Pulmonary effort is normal. No respiratory distress.     Breath sounds: Normal breath sounds. No stridor. No wheezing, rhonchi or rales.  Chest:     Chest wall: No tenderness.  Abdominal:     General: Abdomen is flat. There is no distension.     Palpations: Abdomen is soft.     Tenderness: There is no abdominal tenderness. There is no right CVA tenderness, left CVA tenderness or guarding.  Musculoskeletal:     Cervical back: Normal range of motion and neck supple. No rigidity or tenderness.     Right lower leg: No edema.     Left lower leg: No edema.  Skin:    General: Skin is warm and dry.     Findings: No bruising, erythema or lesion.  Neurological:      General: No focal deficit present.     Mental Status: She is alert and oriented to person, place, and time. Mental status is at baseline.     Sensory: No sensory deficit.     Motor: No weakness.     Comments: No facial asymmetry, no ataxia, no apraxia, no aphasia, no arm drift, normal coordination with finger-to-nose, normal sensation to both upper and lower extremities bilaterally, normal grip strength bilaterally, normal strength to both flexion and extension to both upper lower extremities 5+ bilaterally, no visual field deficits, no nystagmus.  Psychiatric:        Mood and Affect: Mood normal.     (all labs ordered are listed, but only abnormal results are displayed) Labs Reviewed  PROTIME-INR - Abnormal; Notable for the following components:      Result Value   Prothrombin Time 15.3 (*)    All other components within normal limits  CBC - Abnormal; Notable for the following components:   Hemoglobin 11.6 (*)    All other components within normal limits  COMPREHENSIVE METABOLIC PANEL WITH GFR - Abnormal; Notable for the following components:   Glucose, Bld 109 (*)    Creatinine, Ser 1.03 (*)    Albumin 3.4 (*)    AST 14 (*)    GFR, Estimated 59 (*)    All other components within normal limits  URINALYSIS, ROUTINE W REFLEX MICROSCOPIC - Abnormal; Notable for the following components:   APPearance CLOUDY (*)    All other components within normal limits  I-STAT CHEM 8, ED - Abnormal; Notable for the following components:   Glucose, Bld 103 (*)    All other components within normal limits  APTT  DIFFERENTIAL  ETHANOL  CBG MONITORING, ED  TROPONIN I (HIGH SENSITIVITY)  TROPONIN I (HIGH SENSITIVITY)    EKG: EKG Interpretation Date/Time:  Thursday August 21 2023 12:02:53 EDT Ventricular Rate:  58 PR Interval:  150 QRS Duration:  74 QT Interval:  458 QTC Calculation: 449 R Axis:   17  Text Interpretation: Sinus bradycardia with sinus arrhythmia Anterior infarct , age  undetermined Abnormal ECG When compared with ECG of 01-Jan-2023 13:39, No significant change since last tracing Confirmed by Francesca Fallow ([id number]) on 08/21/2023 3:18:30 PM  Radiology: MR BRAIN WO CONTRAST Result Date: 08/21/2023 CLINICAL DATA:  Neuro deficit, acute, stroke suspected EXAM: MRI HEAD WITHOUT CONTRAST TECHNIQUE: Multiplanar, multiecho pulse sequences of the brain and surrounding structures were obtained without intravenous contrast. COMPARISON:  CT head from earlier today. FINDINGS: Brain: No acute infarction, hemorrhage, hydrocephalus, extra-axial collection or mass lesion. Vascular: Normal flow voids. Skull and upper cervical spine: Normal marrow signal. Sinuses/Orbits: Negative. IMPRESSION: No evidence of acute intracranial abnormality. Electronically Signed   By: Gilmore GORMAN Molt M.D.   On: 08/21/2023 21:52   CT HEAD WO CONTRAST Result Date: 08/21/2023 CLINICAL DATA:  Dizziness EXAM: CT HEAD WITHOUT CONTRAST TECHNIQUE: Contiguous axial images were obtained from the base of the skull through the vertex without intravenous contrast. RADIATION DOSE REDUCTION: This exam was performed according to the departmental dose-optimization program which includes automated exposure control, adjustment of the mA and/or kV according to patient size and/or use of iterative reconstruction technique. COMPARISON:  CT head 01/01/2023 FINDINGS: Brain: No acute intracranial hemorrhage. No CT evidence of acute infarct. No edema, mass effect, or midline shift. The basilar cisterns are patent. Ventricles: The ventricles are normal. Vascular: Similar atherosclerotic calcifications in the carotid siphons. No hyperdense vessel. Skull: No acute or aggressive finding. Orbits: Orbits are symmetric. Sinuses: Mild mucosal thickening in the ethmoid sinuses. Other: Mastoid air cells are clear. IMPRESSION: No CT evidence of acute intracranial abnormality. Electronically Signed   By: Donnice Mania M.D.   On: 08/21/2023 13:03     Procedures   Medications Ordered in the ED  lactated ringers  bolus 1,000 mL (0 mLs Intravenous Stopped 08/21/23 1820)  ondansetron  (ZOFRAN ) injection 4 mg (4 mg Intravenous Given 08/21/23 1630)  meclizine  (ANTIVERT ) tablet 25 mg (25 mg Oral Given 08/21/23 1719)  LORazepam  (ATIVAN ) injection 0.5 mg (0.5 mg Intravenous Given 08/21/23  1818)  meclizine  (ANTIVERT ) tablet 25 mg (25 mg Oral Given 08/21/23 2321)                                Medical Decision Making Amount and/or Complexity of Data Reviewed Labs: ordered. Radiology: ordered.  Risk Prescription drug management.   This patient is a 68 year old female who presents to the ED for concern of acute vertigo that started 2 days ago and has been unrelenting, worse with repositioning of the head.  Accompanied with 2 episodes of vomiting as well as persistent nausea and frontal headache that she describes as mild.  On physical exam, patient is in no acute distress, afebrile, alert and orient x 4, speaking in full sentences, nontachypneic.  Noted to have a borderline bradycardia with heart rate of 54 to 60 bpm.  Normal neuroexam.  LCTAB, no murmur, no lower leg edema.  No abdominal tenderness palpation, no CVA tenderness.  Unremarkable exam otherwise.  Initial workup was ordered in triage, with no acute changes noted on her labs.  CT scan was negative.  However with patient experiencing persistent vertigo, will provide meclizine  as well as fluids for suspected dehydration with her noting that she has not drinking which she normally has.  However with symptoms, we will order MRI to rule out occult CVA.  MRI/MRA is negative for any acute findings.  Low suspicion for any central causes of vertigo at this time.  Initial meclizine  did help however upon ambulation which was approximately 6 hours after initial dose, patient was still experiencing some unilateral vertigo, furthering suspicion for peripheral vertigo.  Provided second dose of meclizine   and had her walk again with improvement.  Low suspicion for any arrhythmias or cardiac causes of patient's vertigo today.  Will have her continue to follow-up with PCP as well as ENT for further evaluation.  Will send home with meclizine .  Patient vital signs have remained stable throughout the course of patient's time in the ED. Low suspicion for any other emergent pathology at this time. I believe this patient is safe to be discharged. Provided strict return to ER precautions. Patient expressed agreement and understanding of plan. All questions were answered.  Differential diagnoses prior to evaluation: The emergent differential diagnosis includes, but is not limited to, tension headache, migraine, polypharmacy, substance abuse, sinusitis, cervicogenic headache, dehydration, cluster headache, trigeminal neuralgia, IIH, PRES syndrome, intracranial bleed, CVA, labyrinthitis, BPPV, Mnire's disease. This is not an exhaustive differential.   Past Medical History / Co-morbidities / Social History: HTN, anxiety, neuropathy, chronic neck pain, DVT  Additional history: Chart reviewed. Pertinent results include:  Seen on 08/04/2023 for cervical disc disease.  Noted to have been reporting chronic right-sided pain which is chronic.  Lab Tests/Imaging studies: I personally interpreted labs/imaging and the pertinent results include:   CBC unremarkable  CMP stable GFR and Cr Troponin and delta unremarkable APTT unremarkable PT/INR shows elevated PT of 15.3 UA unremarkable I-STAT Chem-8 unremarkable Ethanol unremarkable CT head unremarkable MRI brain shows no acute processes I agree with the radiologist interpretation.  Cardiac monitoring: EKG obtained and interpreted by myself and attending physician which shows: sinus bradycardia with sinus arrythmia  EKG Interpretation Date/Time:  Thursday August 21 2023 12:02:53 EDT Ventricular Rate:  58 PR Interval:  150 QRS Duration:  74 QT  Interval:  458 QTC Calculation: 449 R Axis:   17  Text Interpretation: Sinus bradycardia with sinus arrhythmia Anterior infarct , age  undetermined Abnormal ECG When compared with ECG of 01-Jan-2023 13:39, No significant change since last tracing Confirmed by Francesca Fallow ([id number]) on 08/21/2023 3:18:30 PM      ]    Medications: I ordered medication including meclizine .  I have reviewed the patients home medicines and have made adjustments as needed.  Critical Interventions: none  Social Determinants of Health: none  Disposition: After consideration of the diagnostic results and the patients response to treatment, I feel that the patient would benefit from discharge home as above.   emergency department workup does not suggest an emergent condition requiring admission or immediate intervention beyond what has been performed at this time. The plan is: follow up with PCP, meclizine  for vertigo, return for any new or worsening Sx. The patient is safe for discharge and has been instructed to return immediately for worsening symptoms, change in symptoms or any other concerns.  Final diagnoses:  Vertigo    ED Discharge Orders          Ordered    meclizine  (ANTIVERT ) 25 MG tablet  3 times daily PRN        08/21/23 2243    ondansetron  (ZOFRAN ) 4 MG tablet  Every 6 hours        08/21/23 2244               Beola Terrall RAMAN, PA-C 08/22/23 0017    Francesca Fallow CROME, MD 08/22/23 [phone]

## 2023-08-21 NOTE — ED Notes (Signed)
 Pt back from MRI

## 2023-08-22 NOTE — ED Notes (Signed)
Pt ambulated with minimal assistance.

## 2023-12-10 ENCOUNTER — Ambulatory Visit: Attending: Cardiology | Admitting: Cardiology

## 2023-12-10 ENCOUNTER — Encounter: Payer: Self-pay | Admitting: Cardiology

## 2023-12-10 VITALS — BP 120/80 | HR 62 | Ht 65.5 in | Wt 204.8 lb

## 2023-12-10 DIAGNOSIS — R001 Bradycardia, unspecified: Secondary | ICD-10-CM

## 2023-12-10 DIAGNOSIS — R0602 Shortness of breath: Secondary | ICD-10-CM | POA: Diagnosis not present

## 2023-12-10 DIAGNOSIS — R0600 Dyspnea, unspecified: Secondary | ICD-10-CM | POA: Diagnosis not present

## 2023-12-10 MED ORDER — METOPROLOL SUCCINATE ER 25 MG PO TB24: 25.0000 mg | ORAL_TABLET | Freq: Every day | ORAL | 3 refills | Status: AC

## 2023-12-10 MED ORDER — FUROSEMIDE 40 MG PO TABS: 60.0000 mg | ORAL_TABLET | Freq: Every day | ORAL | 3 refills | Status: AC

## 2023-12-10 NOTE — Patient Instructions (Signed)
 Medication Instructions:  Your physician has recommended you make the following change in your medication:  Decrease Metoprolol  from 50 mg to 25 mg once daily  Increase Lasix  from 40 mg to 60 mg once daily  Continue taking all other medications as prescribed   Labwork: BNP, BMET and Magnesium in 2 weeks at [facility] in [place]   Testing/Procedures: Your physician has requested that you have an echocardiogram. Echocardiography is a painless test that uses sound waves to create images of your heart. It provides your doctor with information about the size and shape of your heart and how well your heart's chambers and valves are working. This procedure takes approximately one hour. There are no restrictions for this procedure. Please do NOT wear cologne, perfume, aftershave, or lotions (deodorant is allowed). Please arrive 15 minutes prior to your appointment time.  Please note: We ask at that you not bring children with you during ultrasound (echo/ vascular) testing. Due to room size and safety concerns, children are not allowed in the ultrasound rooms during exams. Our front office staff cannot provide observation of children in our lobby area while testing is being conducted. An adult accompanying a patient to their appointment will only be allowed in the ultrasound room at the discretion of the ultrasound technician under special circumstances. We apologize for any inconvenience.   Follow-Up: Your physician recommends that you schedule a follow-up appointment in: 4-6 weeks Mir[place]  Any Other Special Instructions Will Be Listed Below (If Applicable). Thank you for choosing [facility]!     If you need a refill on your cardiac medications before your next appointment, please call your pharmacy.

## 2023-12-10 NOTE — Progress Notes (Signed)
 Clinical Summary Ms. Kall is a 68 y.o.female seen today as a new consult, referred by Dr Maree for the following medical problems.   Previously evaluated by [facility] for chest pain in 2015   1.Bradycardia - has been on metoprolol  - EKG 07/2023 with pcp showed sinus brady at 54 - can have dizziness with walking.  - working to stay well hydrated, drinks about 3 bottles a day of water.     2. DOE - DOE with activities. DOE walking at the store, has to stop because she is SOB.  - can have some LE edema. She is on lasix .    3.History of DVT - on xarelto  - she reports evaluted by her hematologist, was told she is hypercoagulable.   4.Chronic pain - chronic back pain, pain feet.    5.Chest pain - prior evaluaton in 2015 at [facility] - very typical symptoms according to notes, based on strength of risk factors and symptoms referred for cath - 05/2013 cath at [facility] not able to see I nepic  Past Medical History:  Diagnosis Date   Anxiety    Breast cancer (HCC)    Chronic back pain    Chronic neck pain    Depression    High cholesterol    History of suicidal ideation    Hypertension    Lumbar radiculopathy    Neuropathy      Allergies  Allergen Reactions   Hydrocodone Anaphylaxis, Hives and Rash   Tramadol Anaphylaxis, Hives and Rash   Oxycodone Other (See Comments)    Burning to lips   Codeine Hives and Rash   Darvocet [Propoxyphene N-Acetaminophen ] Rash   Eliquis  [Apixaban ] Itching     Current Outpatient Medications  Medication Sig Dispense Refill   acetaminophen  (TYLENOL ) 500 MG tablet Take 1,000 mg by mouth every 8 (eight) hours as needed for mild pain or moderate pain.     amitriptyline  (ELAVIL ) 10 MG tablet Take 1 tablet (10 mg total) by mouth at bedtime. 14 tablet 0   atorvastatin (LIPITOR) 10 MG tablet Take 10 mg by mouth daily.     bisacodyl  (BISACODYL ) 5 MG EC tablet Take 10 mg by mouth daily as needed for moderate constipation.     doxycycline   (VIBRAMYCIN ) 100 MG capsule Take 1 capsule (100 mg total) by mouth 2 (two) times daily. One po bid x 7 days 14 capsule 0   DULoxetine (CYMBALTA) 60 MG capsule Take 60 mg by mouth daily.      ezetimibe  (ZETIA ) 10 MG tablet Take 10 mg by mouth daily.     fluticasone  (FLONASE ) 50 MCG/ACT nasal spray Place 1 spray into both nostrils daily.     furosemide  (LASIX ) 40 MG tablet Take 1 tablet (40 mg total) by mouth daily. 30 tablet 1   gabapentin  (NEURONTIN ) 600 MG tablet Take 600 mg by mouth 3 (three) times daily.     glipiZIDE (GLUCOTROL) 5 MG tablet Take 5 mg by mouth daily before breakfast.     levocetirizine (XYZAL ) 5 MG tablet Take 5 mg by mouth daily.     LINZESS 145 MCG CAPS capsule Take 145 mcg by mouth daily.     LORazepam  (ATIVAN ) 0.5 MG tablet Take 0.5 mg by mouth every 8 (eight) hours as needed for anxiety.     meclizine  (ANTIVERT ) 25 MG tablet Take 1 tablet (25 mg total) by mouth 3 (three) times daily as needed for dizziness. 30 tablet 0   metoprolol  succinate (TOPROL -XL)  50 MG 24 hr tablet Take 50 mg by mouth every morning. Take with or immediately following a meal.     omeprazole (PRILOSEC) 40 MG capsule Take 40 mg by mouth daily.     ondansetron  (ZOFRAN ) 4 MG tablet Take 1 tablet (4 mg total) by mouth every 6 (six) hours. 12 tablet 0   potassium chloride  (KLOR-CON ) 10 MEQ tablet Take 10 mEq by mouth daily.     potassium chloride  SA (KLOR-CON  M) 20 MEQ tablet Take 2 tablets (40 mEq total) by mouth daily. (Patient taking differently: Take 20 mEq by mouth daily.) 30 tablet 1   rivaroxaban  (XARELTO ) 20 MG TABS tablet Take 1 tablet (20 mg total) by mouth daily with supper. Take with food. (Patient taking differently: Take 10 mg by mouth daily with supper. Take with food.) 30 tablet 11   zolpidem  (AMBIEN ) 10 MG tablet Take 10 mg by mouth at bedtime.     No current facility-administered medications for this visit.     Past Surgical History:  Procedure Laterality Date   BACK SURGERY      BREAST LUMPECTOMY     CERVICAL DISC SURGERY     SHOULDER SURGERY     THYROID SURGERY Right      Allergies  Allergen Reactions   Hydrocodone Anaphylaxis, Hives and Rash   Tramadol Anaphylaxis, Hives and Rash   Oxycodone Other (See Comments)    Burning to lips   Codeine Hives and Rash   Darvocet [Propoxyphene N-Acetaminophen ] Rash   Eliquis  [Apixaban ] Itching      No family history on file.   Social History Ms. Mcgrail reports that she has never smoked. She has never used smokeless tobacco. Ms. Hummel reports no history of alcohol use.    Physical Examination Today's Vitals   12/10/23 1314  BP: 120/80  Pulse: 62  SpO2: 98%  Weight: 204 lb 12.8 oz (92.9 kg)  Height: 5' 5.5 (1.664 m)   Body mass index is 33.56 kg/m.  Gen: resting comfortably, no acute distress HEENT: no scleral icterus, pupils equal round and reactive, no palptable cervical adenopathy,  CV:RRR, no mrg, no jvd Resp: Clear to auscultation bilaterally GI: abdomen is soft, non-tender, non-distended, normal bowel sounds, no hepatosplenomegaly MSK: extremities are warm, 1+ bilateral LE edema Skin: warm, no rash Neuro:  no focal deficits Psych: appropriate affect     Assessment and Plan  1.Dizziness - perhaps related to bradycardia, we will lower her toprol  to 25mg  daily. HRs was 54 at pcp office at time of referral, today 51 - orthostatics today were normal  2. DOE - recent DOE, LE edema - check echo - increase lasix  to 60mg  daily, check bmet/mg in 2 weeks   F/u 4-6 weeks   Dorn PHEBE Ross, M.D.

## 2023-12-20 LAB — MAGNESIUM: Magnesium: 2 mg/dL (ref 1.6–2.3)

## 2023-12-20 LAB — BASIC METABOLIC PANEL WITH GFR
BUN/Creatinine Ratio: 11 — ABNORMAL LOW (ref 12–28)
BUN: 13 mg/dL (ref 8–27)
CO2: 25 mmol/L (ref 20–29)
Calcium: 9.1 mg/dL (ref 8.7–10.3)
Chloride: 104 mmol/L (ref 96–106)
Creatinine, Ser: 1.15 mg/dL — ABNORMAL HIGH (ref 0.57–1.00)
Glucose: 114 mg/dL — ABNORMAL HIGH (ref 70–99)
Potassium: 4.7 mmol/L (ref 3.5–5.2)
Sodium: 142 mmol/L (ref 134–144)
eGFR: 51 mL/min/1.73 — ABNORMAL LOW (ref >59)

## 2023-12-20 LAB — BRAIN NATRIURETIC PEPTIDE: BNP: 9.3 pg/mL (ref 0.0–100.0)

## 2023-12-24 ENCOUNTER — Ambulatory Visit: Attending: Cardiology

## 2023-12-24 DIAGNOSIS — R0602 Shortness of breath: Secondary | ICD-10-CM | POA: Diagnosis not present

## 2023-12-24 LAB — ECHOCARDIOGRAM COMPLETE
AR max vel: 2.01 cm2
AV Peak grad: 8.2 mmHg
Ao pk vel: 1.43 m/s
Area-P 1/2: 3.77 cm2
Calc EF: 60.7 %
S' Lateral: 2.4 cm
Single Plane A2C EF: 58.8 %
Single Plane A4C EF: 59.7 %

## 2023-12-28 ENCOUNTER — Ambulatory Visit: Payer: Self-pay | Admitting: Cardiology

## 2023-12-31 ENCOUNTER — Encounter: Payer: Self-pay | Admitting: *Deleted

## 2024-01-15 ENCOUNTER — Encounter: Payer: Self-pay | Admitting: *Deleted

## 2024-01-21 ENCOUNTER — Ambulatory Visit: Attending: Nurse Practitioner | Admitting: Nurse Practitioner

## 2024-01-21 ENCOUNTER — Encounter: Payer: Self-pay | Admitting: Nurse Practitioner

## 2024-01-21 VITALS — BP 126/76 | HR 74 | Ht 65.0 in | Wt 205.0 lb

## 2024-01-21 DIAGNOSIS — R42 Dizziness and giddiness: Secondary | ICD-10-CM | POA: Diagnosis not present

## 2024-01-21 DIAGNOSIS — E785 Hyperlipidemia, unspecified: Secondary | ICD-10-CM

## 2024-01-21 DIAGNOSIS — Z87898 Personal history of other specified conditions: Secondary | ICD-10-CM

## 2024-01-21 DIAGNOSIS — Z86718 Personal history of other venous thrombosis and embolism: Secondary | ICD-10-CM | POA: Diagnosis not present

## 2024-01-21 NOTE — Progress Notes (Signed)
 " Cardiology Office Note   Date:  01/21/2024  ID:  Amanda Peterson, DOB 17-Dec-1955, MRN [id number] PCP: [facility]  [facility] Providers Cardiologist:  Alvan Carrier, MD     History of Present Illness Amanda Peterson is a 68 y.o. female with a PMH of bradycardia, HLD, chest pain, DOE, past history of DVT, and chronic pain, who presents today for 4 to 6-week follow-up.  Last seen by Dr. Alvan on December 10, 2023.  At that time, patient noted some dizziness and noted recent DOE and leg edema.  Was felt that dizziness was perhaps related to bradycardia and Toprol  was lowered to 25 mg daily.  Orthostatics were normal in office that day.  Lasix  increased to 60 mg daily and echo arrange, overall reassuring report.  She is here for 4 to 6-week follow-up visit.  She states she is still having dizziness, however she believes that her dizziness is better than it was previously.  Says she has some times where she is walking and feels as though she is going to fall back.  Denies any recent falls.  Denies any symptoms that sound like vertigo. Denies any chest pain, shortness of breath, palpitations, syncope, presyncope, orthopnea, PND, swelling or significant weight changes, acute bleeding, or claudication.  ROS: Negative.  See HPI.  Studies Reviewed  EKG: EKG is not ordered today.  Echo 11/2023: 1. Left ventricular ejection fraction, by estimation, is 60 to 65%. The  left ventricle has normal function. The left ventricle has no regional  wall motion abnormalities. Left ventricular diastolic parameters were  normal.   2. Right ventricular systolic function is normal. The right ventricular  size is normal. Tricuspid regurgitation signal is inadequate for assessing  PA pressure.   3. The mitral valve is grossly normal. Trivial mitral valve  regurgitation.   4. The aortic valve is tricuspid. Aortic valve regurgitation is not  visualized. No aortic stenosis is present.   5. The  inferior vena cava is normal in size with greater than 50%  respiratory variability, suggesting right atrial pressure of 3 mmHg.   Comparison(s): No prior Echocardiogram.      Physical Exam VS:  BP 126/76 (BP Location: Left Arm, Cuff Size: Large)   Pulse 74   Ht 5' 5 (1.651 m)   Wt 205 lb (93 kg)   SpO2 98%   BMI 34.11 kg/m        Wt Readings from Last 3 Encounters:  01/21/24 205 lb (93 kg)  12/10/23 204 lb 12.8 oz (92.9 kg)  06/05/23 199 lb 15.3 oz (90.7 kg)    GEN: Well nourished, well developed in no acute distress NECK: No JVD; No carotid bruits CARDIAC: S1/S2, RRR, no murmurs, rubs, gallops RESPIRATORY:  Clear to auscultation without rales, wheezing or rhonchi  ABDOMEN: Soft, non-tender, non-distended EXTREMITIES:  No edema; No deformity   ASSESSMENT AND PLAN  Dizziness Etiology unclear and multifactorial.  Does note some improvement since last office visit.  She is on several medications that could be contributing to this.  Denies any specific and overt symptoms of possible vertigo, however she has been prescribed meclizine  in the past.  Her most recent echo was unremarkable.  I did offer/recommend to adjust her beta-blocker dosage, patient declines and stated she would like to leave this as is.  Care and ED precautions discussed. Continue to follow with PCP.  If she were to have any worsening symptoms in the future, would recommend a monitor to be arranged.  History of bradycardia Heart rate today is well-controlled.  Did offer/recommend to adjust her beta-blocker dosage and reduce this further, patient declines.  No medication changes at this time. Heart healthy diet and regular cardiovascular exercise encouraged.   History of DVT  Denies any current issues.  Continue Xarelto . Continue to follow with PCP.  4. HLD No recent labs on file.  Will obtain fasting lipid profile.  Continue current medication regimen.  Continue follow-up with PCP. Heart healthy diet and  regular cardiovascular exercise encouraged.    Dispo: Care and ED precautions discussed.  Follow-up with MD/APP in 6 months or sooner if anything changes.   Signed, Almarie Crate, NP   "

## 2024-01-21 NOTE — Patient Instructions (Signed)
 Medication Instructions:  Your physician recommends that you continue on your current medications as directed. Please refer to the Current Medication list given to you today.   Labwork: Fasting Lipids in 2-3 weeks at [facility]  Testing/Procedures: None today  Follow-Up: 6 months  Any Other Special Instructions Will Be Listed Below (If Applicable).  If you need a refill on your cardiac medications before your next appointment, please call your pharmacy.

## 2024-01-30 ENCOUNTER — Telehealth (HOSPITAL_BASED_OUTPATIENT_CLINIC_OR_DEPARTMENT_OTHER): Payer: Self-pay

## 2024-01-30 NOTE — Telephone Encounter (Signed)
 Pharmacy please advise on holding xarelto  prior to right L5-S1 transforaminal epidural steroid injection scheduled for tbd. Thank you.   Last labs: 07/2023

## 2024-01-30 NOTE — Telephone Encounter (Signed)
 Dr. Alvan, you recently saw this pt in clinic. Are you able to comment on surgical clearance for upcoming Right L5-S1 transforaminal Epidural steroid injection  scheduled for TBD? Please route your response to P CV DIV PREOP. Thank you!

## 2024-01-30 NOTE — Telephone Encounter (Signed)
"  ° °  Pre-operative Risk Assessment    Patient Name: Amanda Peterson  DOB: 1955-12-23 MRN: [id number]   Date of last office visit: 01/21/24 with Miriam  Date of next office visit: 07/13/24 with Miriam  Request for Surgical Clearance    Procedure:  Right L5-S1 transforaminal Epidural steroid injection  Date of Surgery:  Clearance TBD                                 Surgeon:  Dr. Laqueta Socks Group or Practice Name:  [facility] Phone number: [phone]  Fax number:  [phone]   Type of Clearance Requested:   - Medical  - Pharmacy:  Hold Rivaroxaban  (Xarelto ) for 3 days prior    Type of Anesthesia:  Not Indicated   Additional requests/questions:    Bonney Augustin JONETTA Delores   01/30/2024, 3:56 PM   "

## 2024-02-02 NOTE — Telephone Encounter (Signed)
"  ° °  Patient Name: Amanda Peterson  DOB: July 17, 1955 MRN: [id number]  Primary Cardiologist: Alvan Carrier, MD  Chart reviewed as part of pre-operative protocol coverage. Per Dr. Alvan, She is on xarelto  for history of prior DVT that is followed by Dr Serene with vascular, clearance on holding xarelto  were be deferred to vascular. If that's cleared, there is no specific cardiac contraindication for the procedure.    I will route this recommendation to the requesting party via Epic fax function and remove from pre-op pool.  Please call with questions.  Dianah Pruett D Ronnetta Currington, NP 02/02/2024, 1:21 PM  "

## 2024-02-04 LAB — LIPID PANEL
Chol/HDL Ratio: 3.2 ratio (ref 0.0–4.4)
Cholesterol, Total: 136 mg/dL (ref 100–199)
HDL: 43 mg/dL
LDL Chol Calc (NIH): 75 mg/dL (ref 0–99)
Triglycerides: 97 mg/dL (ref 0–149)
VLDL Cholesterol Cal: 18 mg/dL (ref 5–40)

## 2024-02-07 ENCOUNTER — Ambulatory Visit: Payer: Self-pay | Admitting: Nurse Practitioner

## 2024-02-09 ENCOUNTER — Encounter: Payer: Self-pay | Admitting: Surgery

## 2024-03-04 ENCOUNTER — Other Ambulatory Visit: Payer: Self-pay | Admitting: Surgery

## 2024-07-13 ENCOUNTER — Ambulatory Visit: Admitting: Nurse Practitioner
